# Patient Record
Sex: Male | Born: 1975
Health system: Southern US, Community
[De-identification: ages and names within clinical notes are randomized; demographics above are authoritative.]

## PROBLEM LIST (undated history)

## (undated) DIAGNOSIS — E785 Hyperlipidemia, unspecified: Secondary | ICD-10-CM

## (undated) DIAGNOSIS — J45909 Unspecified asthma, uncomplicated: Secondary | ICD-10-CM

## (undated) HISTORY — PX: OTHER SURGICAL HISTORY: SHX169

## (undated) HISTORY — DX: Hyperlipidemia, unspecified: E78.5

---

## 1998-05-21 ENCOUNTER — Inpatient Hospital Stay (HOSPITAL_COMMUNITY): Admission: EM | Admit: 1998-05-21 | Discharge: 1998-05-21 | Payer: Self-pay | Admitting: Emergency Medicine

## 1998-05-21 ENCOUNTER — Encounter: Payer: Self-pay | Admitting: *Deleted

## 2000-03-22 ENCOUNTER — Encounter: Payer: Self-pay | Admitting: Emergency Medicine

## 2000-03-22 ENCOUNTER — Emergency Department (HOSPITAL_COMMUNITY): Admission: EM | Admit: 2000-03-22 | Discharge: 2000-03-22 | Payer: Self-pay | Admitting: Emergency Medicine

## 2000-05-18 ENCOUNTER — Ambulatory Visit (HOSPITAL_COMMUNITY): Admission: RE | Admit: 2000-05-18 | Discharge: 2000-05-18 | Payer: Self-pay | Admitting: Internal Medicine

## 2004-10-13 ENCOUNTER — Emergency Department (HOSPITAL_COMMUNITY): Admission: EM | Admit: 2004-10-13 | Discharge: 2004-10-13 | Payer: Self-pay | Admitting: Emergency Medicine

## 2006-02-14 ENCOUNTER — Encounter: Admission: RE | Admit: 2006-02-14 | Discharge: 2006-02-14 | Payer: Self-pay | Admitting: Internal Medicine

## 2007-11-01 ENCOUNTER — Encounter: Admission: RE | Admit: 2007-11-01 | Discharge: 2007-11-01 | Payer: Self-pay | Admitting: Internal Medicine

## 2014-07-23 ENCOUNTER — Other Ambulatory Visit: Payer: Self-pay | Admitting: Internal Medicine

## 2014-07-23 ENCOUNTER — Ambulatory Visit
Admission: RE | Admit: 2014-07-23 | Discharge: 2014-07-23 | Disposition: A | Discharge status: Home / Self Care | Payer: 59 | Source: Ambulatory Visit | Attending: Internal Medicine | Admitting: Internal Medicine

## 2014-07-23 DIAGNOSIS — K5900 Constipation, unspecified: Secondary | ICD-10-CM

## 2014-08-01 ENCOUNTER — Other Ambulatory Visit: Payer: Self-pay | Admitting: Internal Medicine

## 2014-08-01 DIAGNOSIS — R7989 Other specified abnormal findings of blood chemistry: Secondary | ICD-10-CM

## 2014-08-01 DIAGNOSIS — N289 Disorder of kidney and ureter, unspecified: Secondary | ICD-10-CM

## 2014-08-05 ENCOUNTER — Ambulatory Visit
Admission: RE | Admit: 2014-08-05 | Discharge: 2014-08-05 | Disposition: A | Discharge status: Home / Self Care | Payer: 59 | Source: Ambulatory Visit | Attending: Internal Medicine | Admitting: Internal Medicine

## 2014-08-05 DIAGNOSIS — N289 Disorder of kidney and ureter, unspecified: Secondary | ICD-10-CM

## 2014-08-05 DIAGNOSIS — R7989 Other specified abnormal findings of blood chemistry: Secondary | ICD-10-CM

## 2016-02-05 DIAGNOSIS — J069 Acute upper respiratory infection, unspecified: Secondary | ICD-10-CM | POA: Diagnosis not present

## 2017-02-17 DIAGNOSIS — R202 Paresthesia of skin: Secondary | ICD-10-CM | POA: Diagnosis not present

## 2017-04-07 DIAGNOSIS — K219 Gastro-esophageal reflux disease without esophagitis: Secondary | ICD-10-CM | POA: Diagnosis not present

## 2017-04-07 DIAGNOSIS — R072 Precordial pain: Secondary | ICD-10-CM | POA: Diagnosis not present

## 2017-04-07 DIAGNOSIS — E785 Hyperlipidemia, unspecified: Secondary | ICD-10-CM | POA: Diagnosis not present

## 2017-04-10 ENCOUNTER — Encounter: Payer: Self-pay | Admitting: Cardiovascular Disease

## 2017-04-14 ENCOUNTER — Encounter: Payer: Self-pay | Admitting: Cardiovascular Disease

## 2017-04-14 ENCOUNTER — Telehealth (HOSPITAL_COMMUNITY): Payer: Self-pay

## 2017-04-14 ENCOUNTER — Ambulatory Visit: Payer: 59 | Admitting: Cardiovascular Disease

## 2017-04-14 VITALS — BP 140/88 | HR 67 | Ht 69.0 in | Wt 186.0 lb

## 2017-04-14 DIAGNOSIS — E781 Pure hyperglyceridemia: Secondary | ICD-10-CM

## 2017-04-14 DIAGNOSIS — R0789 Other chest pain: Secondary | ICD-10-CM | POA: Diagnosis not present

## 2017-04-14 NOTE — Patient Instructions (Signed)
Medication Instructions:  Your physician recommends that you continue on your current medications as directed. Please refer to the Current Medication list given to you today.  Testing/Procedures: Your physician has requested that you have an exercise tolerance test. For further information please visit [email or web address] Please also follow instruction sheet, as given.  Your physician has requested that you have an echocardiogram. Echocardiography is a painless test that uses sound waves to create images of your heart. It provides your doctor with information about the size and shape of your heart and how well your heart's chambers and valves are working. This procedure takes approximately one hour. There are no restrictions for this procedure.  [facility]:  [facility]  Follow-Up: As needed unless testing abnormal  Any Other Special Instructions Will Be Listed Below (If Applicable).     If you need a refill on your cardiac medications before your next appointment, please call your pharmacy.

## 2017-04-14 NOTE — Progress Notes (Signed)
Cardiology Office Note    Date:  04/15/2017   ID:  Brandon Lawson, DOB 03/07/1975, MRN [id number]  PCP:  Georgann Housekeeper, MD  Cardiologist:  Nicki Guadalajara, MD   New cardiology consultation referred through the courtesy of Dr. Eula Listen  History of Present Illness:  Brandon Lawson is a 42 y.o. male African-American male who isreferred Dr. Eula Listen for evaluation of recent chest pain and possible stress testing.  [name] denies any cardiac history.  He is active and exercises regularly.  He often runs on a treadmill for at least 3 miles at a 19-21-minute pace.  Recently, he was performing exercises which burbees, push-ups, and jumps.  On that particular day, he most likely did a total of 150 total push-ups.  These are being done in increments of 10-20 at a time at a fast space.  During the latter portion he did start noticing some tenderness in the left lateral chest wall towards his shoulder.  He specifically denies any exertional precipitation of chest pain with runniing.  He was evaluated by Dr. Eula Listen.  Troponin was negative.  There were no acute ECG changes.  Lipid studies were notable for total cholesterol of 199, LDL cholesterol 123, non-HDL cholesterol 138, HDL 61 and triglycerides 78.  He had normal renal function.  He is now referred for cardiology evaluation.    Past Medical History:  Diagnosis Date  . Hyperlipidemia     Current Medications: Outpatient Medications Prior to Visit  Medication Sig Dispense Refill  . Omega-3 1000 MG CAPS Take by mouth.     No facility-administered medications prior to visit.      Allergies:   Patient has no allergy information on record.   Social History   Socioeconomic History  . Marital status: Married    Spouse name: None  . Number of children: 3  . Years of education: None  . Highest education level: Bachelor's degree (e.g., BA, AB, BS)  Social Needs  . Financial resource strain: None  . Food insecurity - worry: None  . Food  insecurity - inability: None  . Transportation needs - medical: None  . Transportation needs - non-medical: None  Occupational History  . 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PT    Employer: [employer]  Tobacco Use  . Smoking status: Former Games developer  . Smokeless tobacco: Current User    Types: Chew  . Tobacco comment: every now and then   Substance and Sexual Activity  . Alcohol use: Yes    Alcohol/week: 4.2 oz    Types: 7 Glasses of wine per week    Comment: Socially   . Drug use: No  . Sexual activity: None  Other Topics Concern  . None  Social History Narrative  . None    Social history is notable in that he graduated from high school.  He played baseball for [employer] as a center [name].  He does not smoke cigarettes, but he chews tobacco.  He drinks occasional wine.  He runs and lifts at least 5 days/week.  Family History:  The patient's family history includes Coronary artery disease in his father; Heart attack in his father and maternal grandmother; Heart disease in his father; Hyperlipidemia in his mother; Hypertension in his mother.  He is just reestablishing a relationship with his father.  His father had open heart surgery.  ROS General: Negative; No fevers, chills, or night sweats;  HEENT: Negative; No changes in vision or hearing, sinus congestion, difficulty swallowing Pulmonary: Negative;  No cough, wheezing, shortness of breath, hemoptysis Cardiovascular:  See HPI GI: Negative; No nausea, vomiting, diarrhea, or abdominal pain GU: Negative; No dysuria, hematuria, or difficulty voiding Musculoskeletal: Negative; no myalgias, joint pain, or weakness Hematologic/Oncology: Negative; no easy bruising, bleeding Endocrine: Negative; no heat/cold intolerance; no diabetes Neuro: Negative; no changes in balance, headaches Skin: Negative; No rashes or skin lesions Psychiatric: Negative; No behavioral problems, depression Sleep: Negative; No snoring, daytime sleepiness,  hypersomnolence, bruxism, restless legs, hypnogognic hallucinations, no cataplexy Other comprehensive 14 point system review is negative.   PHYSICAL EXAM:   VS:  BP 140/88   Pulse 67   Ht 5\' 9"  (1.753 m)   Wt 186 lb (84.4 kg)   BMI 27.47 kg/m     Repeat blood pressure by me was 124/84 supine and 126/84 standing  Wt Readings from Last 3 Encounters:  04/14/17 186 lb (84.4 kg)    General: Alert, oriented, no distress.  Skin: normal turgor, no rashes, warm and dry HEENT: Normocephalic, atraumatic. Pupils equal round and reactive to light; sclera anicteric; extraocular muscles intact; Fundi normal Nose without nasal septal hypertrophy Mouth/Parynx benign; Mallinpatti scale 2/3 Neck: No JVD, no carotid bruits; normal carotid upstroke Lungs: clear to ausculatation and percussion; no wheezing or rales Chest wall: without tenderness to palpitation Heart: PMI not displaced, RRR, s1 s2 normal, 1/6 systolic murmur, no diastolic murmur, no rubs, gallops, thrills, or heaves Abdomen: soft, nontender; no hepatosplenomehaly, BS+; abdominal aorta nontender and not dilated by palpation. Back: no CVA tenderness Pulses 2+ Musculoskeletal: full range of motion, normal strength, no joint deformities Extremities: no clubbing cyanosis or edema, Homan's sign negative  Neurologic: grossly nonfocal; Cranial nerves grossly wnl Psychologic: Normal mood and affect   Studies/Labs Reviewed:   EKG:  EKG is ordered today.  ECG (independently read by me): Sinus rhythm at 67 bpm.  No ectopy; normal intervals  I personally reviewed the ECG from [facility] dated April 07, 2017 which showed sinus rhythm at 78 bpm.  No ST segment changes.  Recent Labs: No flowsheet data found.   No flowsheet data found.  No flowsheet data found. No results found for: MCV No results found for: TSH No results found for: HGBA1C   BNP No results found for: BNP  ProBNP No results found for: PROBNP   Lipid Panel  No results  found for: CHOL, TRIG, HDL, CHOLHDL, VLDL, LDLCALC, LDLDIRECT   RADIOLOGY: No results found.   Additional studies/ records that were reviewed today include:  Records from [place] were reviewed.    ASSESSMENT:    1. Atypical chest pain   2. Pure hyperglyceridemia      PLAN:  Mr. Kaisyn Millea is a 42 year old active gentleman who exercises regularly.  He recently developed left-sided chest discomfort which seem to be in the region of the pectoral tendon which most likely was mild tendon inflammation resulting from his physical workout with numerous fast push-ups and Burpee's.  His chest pain did not occur with activity and developed the discofort  after working out.  I suspect this was chest wall pain rather than ischemic chest pain from underlying coronary artery disease.  However, there is nicotine use with chewing tobacco we discussed the importance of complete discontinuance.  I also spent considerable time with him reviewing data regarding subclinical atherosclerosis.  His LDL level is 123 and at a level where subclinical plaque can develop.  He has been hesitant to exercise until his cardiac evaluation.  I scheduled him for routine treadmill  test as an initial screen of his chest pain.  This will hopefully reassurance of the results.  I will also schedule him for echo Doppler study to make certain there is no structural heart disease or abnormal hypertrophy.  We discussed potential initiation of lipid-lowering therapy if he cannot achieve an LDL less than 100 and discussed with him data regarding plaque regression at levels typically seen in the 60s or below.  I have recommended repeat laboratory in approximately 3 months.  If his studies are normal, I will notify him of the results and be available on an as-needed basis.   Medication Adjustments/Labs and Tests Ordered: Current medicines are reviewed at length with the patient today.  Concerns regarding medicines are outlined above.   Medication changes, Labs and Tests ordered today are listed in the Patient Instructions below. Patient Instructions  Medication Instructions:  Your physician recommends that you continue on your current medications as directed. Please refer to the Current Medication list given to you today.  Testing/Procedures: Your physician has requested that you have an exercise tolerance test. For further information please visit [email or web address]. Please also follow instruction sheet, as given.  Your physician has requested that you have an echocardiogram. Echocardiography is a painless test that uses sound waves to create images of your heart. It provides your doctor with information about the size and shape of your heart and how well your heart's chambers and valves are working. This procedure takes approximately one hour. There are no restrictions for this procedure.  [facility]  [facility]  Follow-Up: As needed unless testing abnormal  Any Other Special Instructions Will Be Listed Below (If Applicable).     If you need a refill on your cardiac medications before your next appointment, please call your pharmacy.      Signed, Nicki Guadalajarahomas Kelly, MD  04/15/2017 10:59 AM    [facility] [address], [address], [place], [place]  [id number] Phone: [phone]

## 2017-04-14 NOTE — Telephone Encounter (Signed)
Encounter complete. 

## 2017-04-15 ENCOUNTER — Encounter: Payer: Self-pay | Admitting: Cardiovascular Disease

## 2017-04-18 ENCOUNTER — Ambulatory Visit (HOSPITAL_COMMUNITY)
Admission: RE | Admit: 2017-04-18 | Discharge: 2017-04-18 | Disposition: A | Discharge status: Home / Self Care | Payer: 59 | Source: Ambulatory Visit | Attending: Cardiovascular Disease | Admitting: Cardiovascular Disease

## 2017-04-18 DIAGNOSIS — R0789 Other chest pain: Secondary | ICD-10-CM | POA: Insufficient documentation

## 2017-04-18 LAB — EXERCISE TOLERANCE TEST
CHL RATE OF PERCEIVED EXERTION: 18
CSEPED: 15 min
CSEPEDS: 0 s
CSEPEW: 17.2 METS
CSEPPHR: 179 {beats}/min
MPHR: 178 {beats}/min
Percent HR: 100 %
Rest HR: 90 {beats}/min

## 2017-04-20 ENCOUNTER — Ambulatory Visit (HOSPITAL_COMMUNITY): Payer: 59 | Attending: Cardiology

## 2017-04-20 ENCOUNTER — Other Ambulatory Visit: Payer: Self-pay

## 2017-04-20 DIAGNOSIS — R0789 Other chest pain: Secondary | ICD-10-CM | POA: Insufficient documentation

## 2017-04-20 DIAGNOSIS — E785 Hyperlipidemia, unspecified: Secondary | ICD-10-CM | POA: Diagnosis not present

## 2017-04-20 DIAGNOSIS — I77810 Thoracic aortic ectasia: Secondary | ICD-10-CM | POA: Insufficient documentation

## 2017-04-20 DIAGNOSIS — R079 Chest pain, unspecified: Secondary | ICD-10-CM | POA: Diagnosis present

## 2017-04-28 ENCOUNTER — Encounter: Payer: Self-pay | Admitting: *Deleted

## 2018-03-07 DIAGNOSIS — Z0000 Encounter for general adult medical examination without abnormal findings: Secondary | ICD-10-CM | POA: Diagnosis not present

## 2018-03-07 DIAGNOSIS — E785 Hyperlipidemia, unspecified: Secondary | ICD-10-CM | POA: Diagnosis not present

## 2018-12-11 ENCOUNTER — Other Ambulatory Visit: Payer: Self-pay

## 2018-12-11 DIAGNOSIS — Z20822 Contact with and (suspected) exposure to covid-19: Secondary | ICD-10-CM

## 2018-12-13 LAB — NOVEL CORONAVIRUS, NAA: SARS-CoV-2, NAA: NOT DETECTED

## 2018-12-14 ENCOUNTER — Emergency Department (HOSPITAL_COMMUNITY)
Admission: EM | Admit: 2018-12-14 | Discharge: 2018-12-14 | Disposition: A | Discharge status: Home / Self Care | Payer: 59 | Attending: Emergency Medicine | Admitting: Emergency Medicine

## 2018-12-14 ENCOUNTER — Emergency Department (HOSPITAL_COMMUNITY): Payer: 59

## 2018-12-14 ENCOUNTER — Other Ambulatory Visit: Payer: Self-pay

## 2018-12-14 ENCOUNTER — Encounter (HOSPITAL_COMMUNITY): Payer: Self-pay | Admitting: Emergency Medicine

## 2018-12-14 DIAGNOSIS — R5383 Other fatigue: Secondary | ICD-10-CM | POA: Diagnosis not present

## 2018-12-14 DIAGNOSIS — R0981 Nasal congestion: Secondary | ICD-10-CM | POA: Diagnosis not present

## 2018-12-14 DIAGNOSIS — F17220 Nicotine dependence, chewing tobacco, uncomplicated: Secondary | ICD-10-CM | POA: Diagnosis not present

## 2018-12-14 DIAGNOSIS — Z79899 Other long term (current) drug therapy: Secondary | ICD-10-CM | POA: Diagnosis not present

## 2018-12-14 DIAGNOSIS — J45909 Unspecified asthma, uncomplicated: Secondary | ICD-10-CM | POA: Insufficient documentation

## 2018-12-14 DIAGNOSIS — R0789 Other chest pain: Secondary | ICD-10-CM

## 2018-12-14 HISTORY — DX: Unspecified asthma, uncomplicated: J45.909

## 2018-12-14 LAB — BASIC METABOLIC PANEL
Anion gap: 8 (ref 5–15)
BUN: 11 mg/dL (ref 6–20)
CO2: 28 mmol/L (ref 22–32)
Calcium: 9.5 mg/dL (ref 8.9–10.3)
Chloride: 102 mmol/L (ref 98–111)
Creatinine, Ser: 1.19 mg/dL (ref 0.61–1.24)
GFR calc Af Amer: >60 mL/min (ref >60)
GFR calc non Af Amer: >60 mL/min (ref >60)
Glucose, Bld: 93 mg/dL (ref 70–99)
Potassium: 4.3 mmol/L (ref 3.5–5.1)
Sodium: 138 mmol/L (ref 135–145)

## 2018-12-14 LAB — TROPONIN I (HIGH SENSITIVITY)
Troponin I (High Sensitivity): <2 ng/L (ref <18)
Troponin I (High Sensitivity): <2 ng/L (ref <18)

## 2018-12-14 LAB — CBC
HCT: 48.3 % (ref 39.0–52.0)
Hemoglobin: 16.2 g/dL (ref 13.0–17.0)
MCH: 30.9 pg (ref 26.0–34.0)
MCHC: 33.5 g/dL (ref 30.0–36.0)
MCV: 92.2 fL (ref 80.0–100.0)
Platelets: 246 10*3/uL (ref 150–400)
RBC: 5.24 MIL/uL (ref 4.22–5.81)
RDW: 11.7 % (ref 11.5–15.5)
WBC: 4.8 10*3/uL (ref 4.0–10.5)
nRBC: 0 % (ref 0.0–0.2)

## 2018-12-14 MED ORDER — CETIRIZINE-PSEUDOEPHEDRINE ER 5-120 MG PO TB12: 1.0000 | ORAL_TABLET | Freq: Every day | ORAL | 0 refills | Status: DC

## 2018-12-14 MED ORDER — FLUTICASONE PROPIONATE 50 MCG/ACT NA SUSP: 2.0000 | Freq: Every day | NASAL | 1 refills | Status: DC

## 2018-12-14 MED ORDER — DEXAMETHASONE SODIUM PHOSPHATE 4 MG/ML IJ SOLN
4.0000 mg | Freq: Once | INTRAMUSCULAR | Status: AC
  Administered 2018-12-14: 20:00:00 4 mg via INTRAVENOUS
  Filled 2018-12-14: qty 1

## 2018-12-14 NOTE — ED Notes (Signed)
Pt provided labeled urine specimen cup and culture. [employer]

## 2018-12-14 NOTE — Discharge Instructions (Addendum)
Please return if you have any new or concerning symptoms including chest pain, difficulty breathing, or any new or concerning symptoms.  Use Sudafed as needed for congestion.  This will increase her blood pressure her you do not have elevated blood pressure in ED today with concern for this being a problem.

## 2018-12-14 NOTE — ED Triage Notes (Addendum)
Per pt, states he has seasonal allergies-has been having some chest tightness for about a week-states he usually runs 3 miles every other day-only able to walk about a mile-states chest tightness only with exertion-called PCP and was given an inhaler-states minimal improvement with inhaler

## 2018-12-14 NOTE — ED Provider Notes (Addendum)
[facility]-EMERGENCY DEPT Provider Note   CSN: [id number] Arrival date & time: 12/14/18  1341     History   Chief Complaint Chief Complaint  Patient presents with  . Chest Pain    HPI Brandon Lawson is a 43 y.o. male seasonal allergies and asthma, HLD  HPI: A 43 year old patient with a history of hypercholesterolemia presents for evaluation of chest pain. Initial onset of pain was more than 6 hours ago. The patient's chest pain is described as heaviness/pressure/tightness and is worse with exertion. The patient's chest pain is not middle- or left-sided, is not well-localized, is not sharp and does not radiate to the arms/jaw/neck. The patient does not complain of nausea and denies diaphoresis. The patient has no history of stroke, has no history of peripheral artery disease, has not smoked in the past 90 days, denies any history of treated diabetes, has no relevant family history of coronary artery disease (first degree relative at less than age 44), is not hypertensive and does not have an elevated BMI (>=30).   HPI  Patient states he has had chest tightness for 1 week associated symptoms include sinus congestion, fatigue.  Patient states he has these symptoms during the fall every year typically uses an albuterol inhaler prescribed by his PCP to alleviate symptoms during his time however he states symptoms only been moderately improved with the albuterol inhaler.  States he has used Alka-Seltzer as well without relief.  Has tried no other medications.  Patient states he ran 3 miles every day and over the past week he has felt less energetic and was able to run 1 mile a day and has not felt like running last couple days.  Patient denies any chest pain, arm pain, jaw pain.   Patient was tested within the week for Covid test negative.  Denies any known exposure to Covid positive people.  Denies any history of clot, recent travel, surgery, trauma, not on hormones, no  hemoptysis or cough, no pleuritic chest pain.  Past Medical History:  Diagnosis Date  . Asthma   . Hyperlipidemia     There are no active problems to display for this patient.   History reviewed. No pertinent surgical history.      Home Medications    Prior to Admission medications   Medication Sig Start Date End Date Taking? Authorizing Provider  Multiple Vitamin (MULTIVITAMIN WITH MINERALS) TABS tablet Take 1 tablet by mouth daily.   Yes [provider]  naphazoline-glycerin (CLEAR EYES REDNESS) 0.012-0.2 % SOLN Place 1-2 drops into both eyes 4 (four) times daily as needed for eye irritation.   Yes [provider]  Omega-3 1000 MG CAPS Take 2 capsules by mouth daily.    Yes [provider]  VENTOLIN HFA 108 (90 Base) MCG/ACT inhaler Inhale 1-2 puffs into the lungs every 4 (four) hours as needed for wheezing or shortness of breath. 12/11/18  Yes [provider]  cetirizine-pseudoephedrine (ZYRTEC-D) 5-120 MG tablet Take 1 tablet by mouth daily. 12/14/18   Joclyn Alsobrook, Rodrigo RanWylder S, PA  fluticasone (FLONASE) 50 MCG/ACT nasal spray Place 2 sprays into both nostrils daily. 12/14/18   Gailen ShelterFondaw, Jejuan Scala S, PA    Family History Family History  Problem Relation Age of Onset  . Hypertension Mother   . Hyperlipidemia Mother   . Heart attack Father   . Heart disease Father   . Coronary artery disease Father   . Heart attack Maternal Grandmother     Social History  Social History   Tobacco Use  . Smoking status: Former Research scientist (life sciences)  . Smokeless tobacco: Current User    Types: Chew  . Tobacco comment: every now and then   Substance Use Topics  . Alcohol use: Yes    Alcohol/week: 7.0 standard drinks    Types: 7 Glasses of wine per week    Comment: Socially   . Drug use: No     Allergies   Patient has no known allergies.   Review of Systems Review of Systems  Constitutional: Positive for fatigue. Negative for chills and fever.  HENT: Positive for  congestion.   Respiratory: Positive for chest tightness and shortness of breath. Negative for cough and wheezing.   Cardiovascular: Negative for chest pain and palpitations.  All other systems reviewed and are negative.    Physical Exam Updated Vital Signs BP 134/88   Pulse (!) 57   Temp 98.1 F (36.7 C) (Oral)   Resp 15   SpO2 100%   Physical Exam Vitals signs and nursing note reviewed.  Constitutional:      General: He is not in acute distress.    Comments: Is well-appearing sitting for bed pleasant healthy male no acute distress breathing well on room air oxygenating at 100%.  Speaking full sentences able to follow commands and answer questions appropriately.  HENT:     Head: Normocephalic and atraumatic.     Nose: Nose normal.  Eyes:     General: No scleral icterus. Neck:     Musculoskeletal: Normal range of motion.  Cardiovascular:     Rate and Rhythm: Normal rate and regular rhythm.     Pulses: Normal pulses.     Heart sounds: Normal heart sounds.  Pulmonary:     Effort: Pulmonary effort is normal. No respiratory distress.     Breath sounds: Normal breath sounds. No wheezing.  Abdominal:     Palpations: Abdomen is soft.     Tenderness: There is no abdominal tenderness.  Musculoskeletal:     Right lower leg: No edema.     Left lower leg: No edema.  Skin:    General: Skin is warm and dry.     Capillary Refill: Capillary refill takes less than 2 seconds.  Neurological:     Mental Status: He is alert. Mental status is at baseline.  Psychiatric:        Mood and Affect: Mood normal.        Behavior: Behavior normal.      ED Treatments / Results  Labs (all labs ordered are listed, but only abnormal results are displayed) Labs Reviewed  BASIC METABOLIC PANEL  CBC  TROPONIN I (HIGH SENSITIVITY)  TROPONIN I (HIGH SENSITIVITY)    EKG EKG Interpretation  Date/Time:  Friday December 14 2018 13:53:32 EST Ventricular Rate:  68 PR Interval:    QRS Duration:  83 QT Interval:  376 QTC Calculation: 400 R Axis:   69 Text Interpretation: Sinus rhythm No significant change was found Confirmed by Gerlene Fee [phone]) on 12/14/2018 5:29:47 PM   Radiology Dg Chest 2 View  Result Date: 12/14/2018 CLINICAL DATA:  Chest pain, chest tightness EXAM: CHEST - 2 VIEW COMPARISON:  08/28/2012 FINDINGS: The heart size and mediastinal contours are within normal limits. Both lungs are clear. The visualized skeletal structures are unremarkable. IMPRESSION: No acute abnormality of the lungs. Electronically Signed   By: Eddie Candle M.D.   On: 12/14/2018 14:33    Procedures Procedures (including critical care time)  Medications Ordered in ED Medications - No data to display   Initial Impression / Assessment and Plan / ED Course  I have reviewed the triage vital signs and the nursing notes.  Pertinent labs & imaging results that were available during my care of the patient were reviewed by me and considered in my medical decision making (see chart for details).     HEAR Score: 2  Patient with history of asthma that he states occurs after rounds time.  Patient states that his frequent rounds he has been feeling some chest tightness and shortness of breath.  Denies any chest pain, denies any recent viral illness.  Doubt pericarditis as there is no friction rub and EKG is within normal limits.  Troponin x2 drawn at triage were normal.  Doubt pneumonia as patient is afebrile with normal lung sounds and no infiltrate on x-ray.  Patient has reassuring benign blood work with no white count no electrolyte abnormalities.  Initial troponin less than 2.  likely symptoms are due to seasonal allergies/asthma.  Prescribed fluticasone and Zyrtec for allergy symptoms and recommended continue use of albuterol inhaler as needed.  Given strict return precautions.  Heart score is 2.  Will discharge patient home with follow-up instructions to see PCP.  Patient vitals within  normal limits other than mild bradycardia which is likely due to patient's physical fit and healthy lifestyle.   Final Clinical Impressions(s) / ED Diagnoses   Final diagnoses:  Chest tightness  Sinus congestion    ED Discharge Orders         Ordered    cetirizine-pseudoephedrine (ZYRTEC-D) 5-120 MG tablet  Daily     12/14/18 1830    fluticasone (FLONASE) 50 MCG/ACT nasal spray  Daily     12/14/18 1830           Gailen Shelter, [place] 12/14/18 1942    Sabas Sous, MD 12/15/18 0044    Gailen Shelter, PA 12/15/18 3818    Sabas Sous, MD 12/16/18 1505

## 2019-05-01 ENCOUNTER — Ambulatory Visit
Admission: RE | Admit: 2019-05-01 | Discharge: 2019-05-01 | Disposition: A | Discharge status: Home / Self Care | Payer: 59 | Source: Ambulatory Visit | Attending: Internal Medicine | Admitting: Internal Medicine

## 2019-05-01 ENCOUNTER — Other Ambulatory Visit: Payer: Self-pay | Admitting: Internal Medicine

## 2019-05-01 DIAGNOSIS — M549 Dorsalgia, unspecified: Secondary | ICD-10-CM

## 2019-12-19 ENCOUNTER — Ambulatory Visit: Payer: 59 | Attending: Internal Medicine

## 2019-12-19 DIAGNOSIS — Z23 Encounter for immunization: Secondary | ICD-10-CM

## 2019-12-19 NOTE — Progress Notes (Signed)
   Covid-19 Vaccination Clinic  Name:  Brandon Lawson    MRN: [id number] DOB: [birth year]  12/19/2019  Mr. Brandon Lawson was observed post Covid-19 immunization for 15 minutes without incident. He was provided with Vaccine Information Sheet and instruction to access the V-Safe system.   Mr. Brandon Lawson was instructed to call 911 with any severe reactions post vaccine: Marland Kitchen Difficulty breathing  . Swelling of face and throat  . A fast heartbeat  . A bad rash all over body  . Dizziness and weakness   Immunizations Administered    Name Date Dose VIS Date Route   Moderna COVID-19 Vaccine 12/19/2019  4:10 AM 0.5 mL 11/20/2019 Intramuscular   Manufacturer: Moderna   Lot: [id number]   NDC: 49675-916-38

## 2021-01-31 HISTORY — PX: COLONOSCOPY: SHX174

## 2021-10-02 IMAGING — CR DG THORACIC SPINE 3V
3 series · 3 of 3 positions shown · non-contrast
Comparison: None.

CLINICAL DATA: Back pain.

EXAM:
THORACIC SPINE - 3 VIEWS

[t t-spine a.p.]
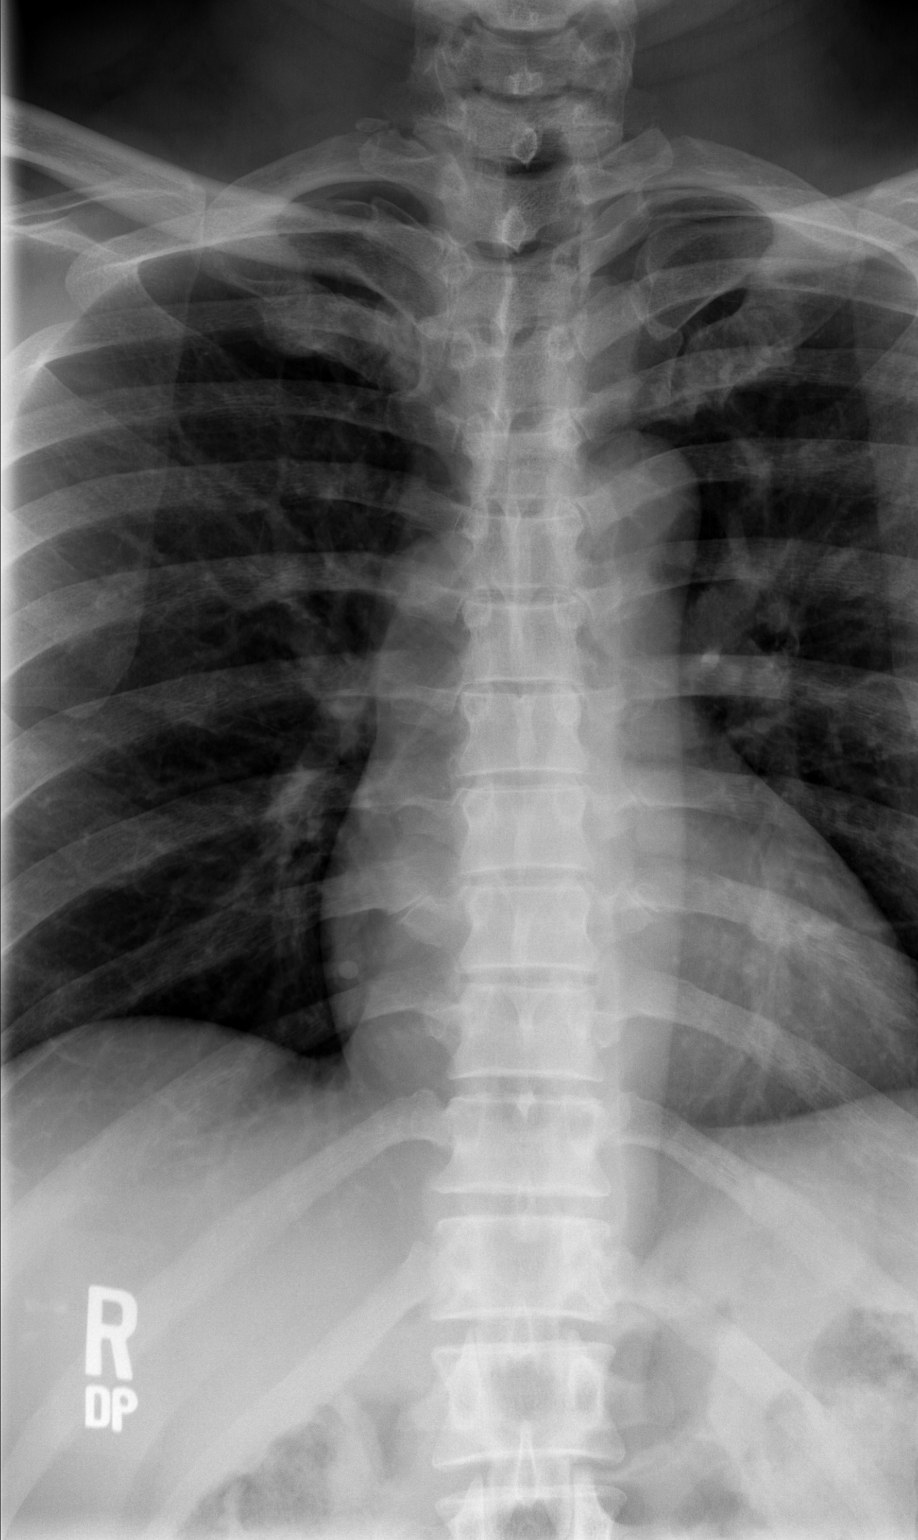

[t t-spine lat *]
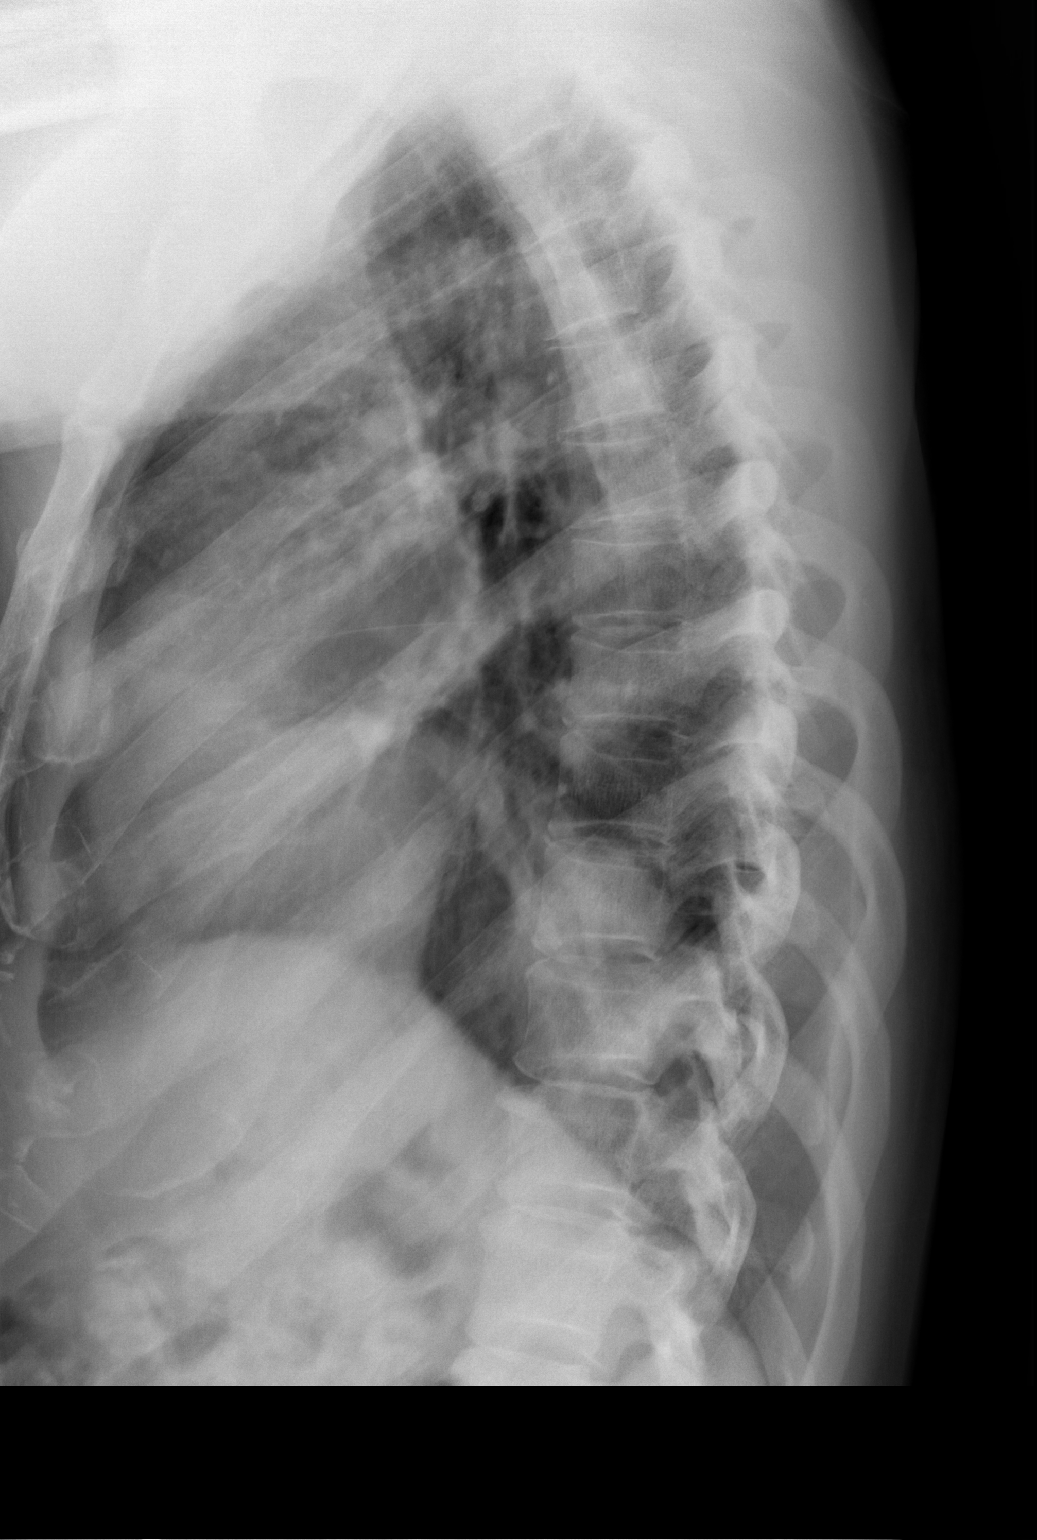

[t swimmers]
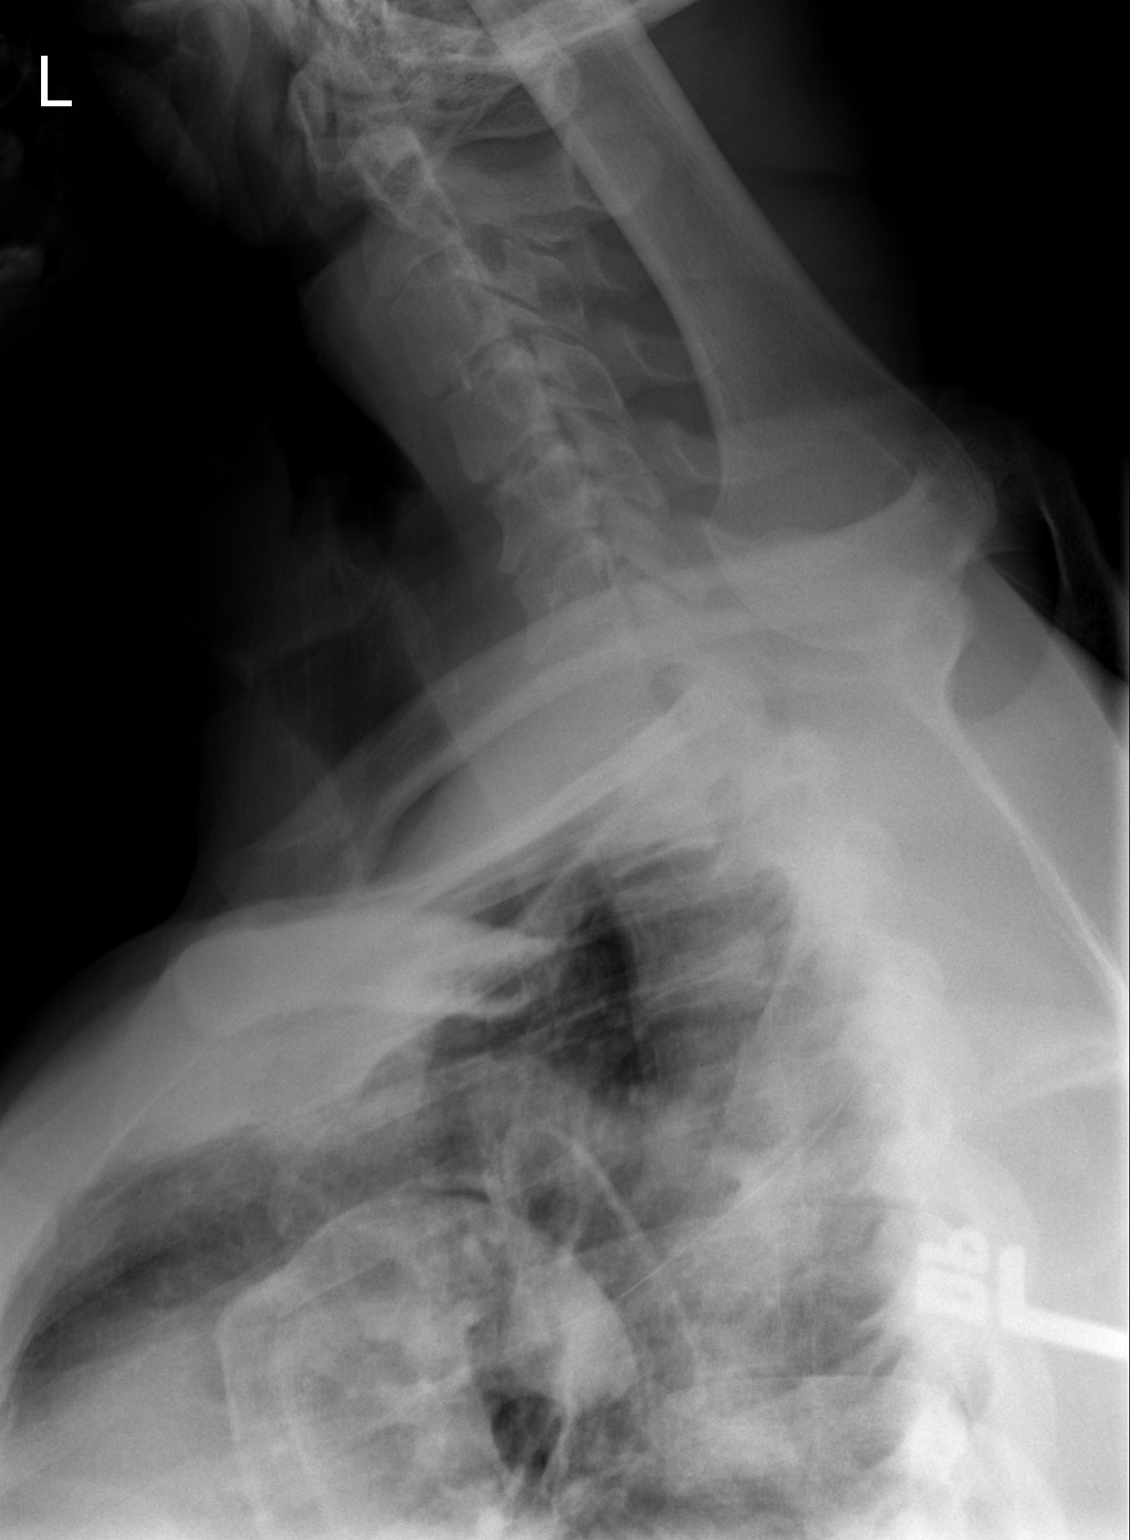

[3 of 3 positions shown; findings below may reference images not displayed]

FINDINGS: There is no evidence of thoracic spine fracture. Alignment is
normal. No other significant bone abnormalities are identified.
IMPRESSION: Negative.

## 2022-01-27 ENCOUNTER — Emergency Department (HOSPITAL_BASED_OUTPATIENT_CLINIC_OR_DEPARTMENT_OTHER)
Admission: EM | Admit: 2022-01-27 | Discharge: 2022-01-27 | Disposition: A | Discharge status: Home / Self Care | Payer: No Typology Code available for payment source | Attending: Emergency Medicine | Admitting: Emergency Medicine

## 2022-01-27 ENCOUNTER — Other Ambulatory Visit: Payer: Self-pay

## 2022-01-27 ENCOUNTER — Emergency Department (HOSPITAL_BASED_OUTPATIENT_CLINIC_OR_DEPARTMENT_OTHER): Payer: 59

## 2022-01-27 DIAGNOSIS — X501XXA Overexertion from prolonged static or awkward postures, initial encounter: Secondary | ICD-10-CM | POA: Insufficient documentation

## 2022-01-27 DIAGNOSIS — S299XXA Unspecified injury of thorax, initial encounter: Secondary | ICD-10-CM | POA: Diagnosis present

## 2022-01-27 DIAGNOSIS — S29011A Strain of muscle and tendon of front wall of thorax, initial encounter: Secondary | ICD-10-CM | POA: Diagnosis not present

## 2022-01-27 DIAGNOSIS — J45909 Unspecified asthma, uncomplicated: Secondary | ICD-10-CM | POA: Insufficient documentation

## 2022-01-27 MED ORDER — NAPROXEN 500 MG PO TABS: 500.0000 mg | ORAL_TABLET | Freq: Two times a day (BID) | ORAL | 0 refills | Status: AC

## 2022-01-27 MED ORDER — IBUPROFEN 800 MG PO TABS
800.0000 mg | ORAL_TABLET | Freq: Once | ORAL | Status: AC
  Administered 2022-01-27: 800 mg via ORAL
  Filled 2022-01-27: qty 1

## 2022-01-27 NOTE — ED Provider Notes (Signed)
[facility] EMERGENCY DEPT Provider Note   CSN: [id number] Arrival date & time: 01/27/22  1619     History  Chief Complaint  Patient presents with   Pectoral Pain     Brandon Lawson is a 46 y.o. male with a past medical history significant for hyperlipidemia and asthma who presents to the ED due to right pectoral muscle pain.  Patient states he was bench pressing when he felt a pop around his right pectoral muscle.  Patient admits to pain with overhead movement.  Admits some intermittent numbness/tingling of right upper extremity.  Denies weakness.  Patient states pain radiates down her right arm. No shortness of breath. Pain worse with movement of right arm.   History obtained from patient and past medical records. No interpreter used during encounter.       Home Medications Prior to Admission medications   Medication Sig Start Date End Date Taking? Authorizing Provider  naproxen (NAPROSYN) 500 MG tablet Take 1 tablet (500 mg total) by mouth 2 (two) times daily. 01/27/22  Yes Oralee Rapaport C, PA-C  cetirizine-pseudoephedrine (ZYRTEC-D) 5-120 MG tablet Take 1 tablet by mouth daily. 12/14/18   Fondaw, Rodrigo Ran, PA  fluticasone (FLONASE) 50 MCG/ACT nasal spray Place 2 sprays into both nostrils daily. 12/14/18   Gailen Shelter, PA  Multiple Vitamin (MULTIVITAMIN WITH MINERALS) TABS tablet Take 1 tablet by mouth daily.    [provider]  naphazoline-glycerin (CLEAR EYES REDNESS) 0.012-0.2 % SOLN Place 1-2 drops into both eyes 4 (four) times daily as needed for eye irritation.    [provider]  Omega-3 1000 MG CAPS Take 2 capsules by mouth daily.     [provider]  VENTOLIN HFA 108 (90 Base) MCG/ACT inhaler Inhale 1-2 puffs into the lungs every 4 (four) hours as needed for wheezing or shortness of breath. 12/11/18   [provider]      Allergies    Patient has no known allergies.    Review of Systems   Review of Systems   Constitutional:  Negative for chills and fever.  Respiratory:  Negative for shortness of breath.   Cardiovascular:  Positive for chest pain (right sided pectoral muscle pain).  Musculoskeletal:  Positive for myalgias.    Physical Exam Updated Vital Signs BP (!) 148/99 (BP Location: Left Arm)   Pulse 79   Temp 98.6 F (37 C) (Oral)   Resp 18   Ht 5\' 9"  (1.753 m)   Wt 86.2 kg   SpO2 100%   BMI 28.06 kg/m  Physical Exam Vitals and nursing note reviewed.  Constitutional:      General: He is not in acute distress.    Appearance: He is not ill-appearing.  HENT:     Head: Normocephalic.  Eyes:     Pupils: Pupils are equal, round, and reactive to light.  Cardiovascular:     Rate and Rhythm: Normal rate and regular rhythm.     Pulses: Normal pulses.     Heart sounds: Normal heart sounds. No murmur heard.    No friction rub. No gallop.  Pulmonary:     Effort: Pulmonary effort is normal.     Breath sounds: Normal breath sounds.  Abdominal:     General: Abdomen is flat. There is no distension.     Palpations: Abdomen is soft.     Tenderness: There is no abdominal tenderness. There is no guarding or rebound.  Musculoskeletal:        General:  Normal range of motion.     Cervical back: Neck supple.     Comments: Decreased ROM of right arm due to pain. Some mild tenderness over pectoralis. No gross deformity. RUE neurovascularly intact.   Skin:    General: Skin is warm and dry.  Neurological:     General: No focal deficit present.     Mental Status: He is alert.  Psychiatric:        Mood and Affect: Mood normal.        Behavior: Behavior normal.     ED Results / Procedures / Treatments   Labs (all labs ordered are listed, but only abnormal results are displayed) Labs Reviewed - No data to display  EKG None  Radiology DG Chest [facility] 1 View  Result Date: 01/27/2022 CLINICAL DATA:  Chest pain EXAM: PORTABLE CHEST 1 VIEW COMPARISON:  12/14/2018 FINDINGS: The heart size  and mediastinal contours are within normal limits. Both lungs are clear. The visualized skeletal structures are unremarkable. IMPRESSION: Normal chest x-ray. Electronically Signed   By: Davina Poke D.O.   On: 01/27/2022 18:14    Procedures Procedures    Medications Ordered in ED Medications  ibuprofen (ADVIL) tablet 800 mg (has no administration in time range)    ED Course/ Medical Decision Making/ A&P                           Medical Decision Making Amount and/or Complexity of Data Reviewed Radiology: ordered and independent interpretation performed. Decision-making details documented in ED Course. ECG/medicine tests: ordered.  Risk Prescription drug management.   46 year old male presents to the ED due to pain in right pectoralis muscle after bench pressing.  He heard a pop and noticed a slight deformity.  He admits to pain with overhead motion.  Upon arrival, stable vitals.  Patient in no acute distress.  Physical exam significant for slight tenderness over right pectoralis muscle.  No gross deformity.  Right upper extremity neurovascularly intact with soft compartments.  Low suspicion for compartment syndrome. Possible rupture vs. Partial rupture vs. Strain.  Chest x-ray ordered at triage which I personally reviewed and interpreted which is negative for signs of pneumonia, pneumothorax or widened mediastinum. EKG demonstrates NSR. No signs of acute ischemia. Low suspicion for cardiac etiology of pain. Patient treated with ibuprofen here in the ED. Orthopedics referral given to patient at discharge and advised to call to schedule an appointment for further evaluation. Patient able to still move RUE, so lower suspicion for full rupture. Patient discharged with naproxen. Strict ED precautions discussed with patient. Patient states understanding and agrees to plan. Patient discharged home in no acute distress and stable vitals  Has PCP Hx. HL and asthma       Final Clinical  Impression(s) / ED Diagnoses Final diagnoses:  Strain of right pectoralis muscle, initial encounter    Rx / DC Orders ED Discharge Orders          Ordered    naproxen (NAPROSYN) 500 MG tablet  2 times daily        01/27/22 1839              Karie Kirks 01/27/22 [place], Dan, DO 01/27/22 1912

## 2022-01-27 NOTE — ED Notes (Signed)
Discharge paperwork given and verbally understood. 

## 2022-01-27 NOTE — ED Triage Notes (Signed)
Pt was working out and while bench pressing weight felt a pop in right pectoral muscle. Pt complains of pain when lifting right arm. Pt states pain radiates down right arm into fingers pain was worse when trying to type on phone.

## 2022-01-27 NOTE — Discharge Instructions (Signed)
It was a pleasure taking care of you today.  As discussed, I have included the number of the orthopedic surgeon.  Please call tomorrow to schedule an appointment for further evaluation.  I am sending you home with pain medication.  Take as needed for pain.  Continue to ice and elevate your right arm.  Return to the ER for any worsening symptoms.

## 2022-02-04 ENCOUNTER — Ambulatory Visit (INDEPENDENT_AMBULATORY_CARE_PROVIDER_SITE_OTHER): Payer: No Typology Code available for payment source | Admitting: Orthopaedic Surgery

## 2022-02-04 DIAGNOSIS — S29011A Strain of muscle and tendon of front wall of thorax, initial encounter: Secondary | ICD-10-CM | POA: Diagnosis not present

## 2022-02-04 NOTE — Progress Notes (Signed)
Chief Complaint: Right pectoralis major tear     History of Present Illness:    Brandon Lawson is a 47 y.o. 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  Since this time he did note bruising in the anterior aspect of the shoulder with a pectoralis deformity compared to the other side.  He has been resting this.  He has been out of work.  This is a workers comp injury.  He works as a Airline pilot.  He does enjoy being active and has 2 children in college.    Surgical History:   None  PMH/PSH/Family History/Social History/Meds/Allergies:    Past Medical History:  Diagnosis Date   Asthma    Hyperlipidemia    No past surgical history on file. Social History   Socioeconomic History   Marital status: Married    Spouse name: Not on file   Number of children: 3   Years of education: Not on file   Highest education level: Bachelor's degree (e.g., BA, AB, BS)  Occupational History   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PT    Employer: [employer]  Tobacco Use   Smoking status: Former   Smokeless tobacco: Current    Types: Chew   Tobacco comments:    every now and then   Vaping Use   Vaping Use: Unknown  Substance and Sexual Activity   Alcohol use: Yes    Alcohol/week: 7.0 standard drinks of alcohol    Types: 7 Glasses of wine per week    Comment: Socially    Drug use: No   Sexual activity: Not on file  Other Topics Concern   Not on file  Social History Narrative   Not on file   Social Determinants of Health   Financial Resource Strain: Not on file  Food Insecurity: Not on file  Transportation Needs: Not on file  Physical Activity: Sufficiently Active (04/14/2017)   Exercise Vital Sign    Days of Exercise per Week: 5 days    Minutes of Exercise per Session: 30 min  Stress: Not on file  Social Connections: Not on  file   Family History  Problem Relation Age of Onset   Hypertension Mother    Hyperlipidemia Mother    Heart attack Father    Heart disease Father    Coronary artery disease Father    Heart attack Maternal Grandmother    No Known Allergies Current Outpatient Medications  Medication Sig Dispense Refill   cetirizine-pseudoephedrine (ZYRTEC-D) 5-120 MG tablet Take 1 tablet by mouth daily. 30 tablet 0   fluticasone (FLONASE) 50 MCG/ACT nasal spray Place 2 sprays into both nostrils daily. 15.8 g 1   Multiple Vitamin (MULTIVITAMIN WITH MINERALS) TABS tablet Take 1 tablet by mouth daily.     naphazoline-glycerin (CLEAR EYES REDNESS) 0.012-0.2 % SOLN Place 1-2 drops into both eyes 4 (four) times daily as needed for eye irritation.     naproxen (NAPROSYN) 500 MG tablet Take 1 tablet (500 mg total) by mouth 2 (two) times daily. 30 tablet 0   Omega-3 1000 MG CAPS Take 2 capsules by mouth daily.      VENTOLIN HFA 108 (90 Base) MCG/ACT  inhaler Inhale 1-2 puffs into the lungs every 4 (four) hours as needed for wheezing or shortness of breath.     No current facility-administered medications for this visit.   No results found.  Review of Systems:   A ROS was performed including pertinent positives and negatives as documented in the HPI.  Physical Exam :   Constitutional: NAD and appears stated age Neurological: Alert and oriented Psych: Appropriate affect and cooperative There were no vitals taken for this visit.   Comprehensive Musculoskeletal Exam:    Tenderness to palpation with palpable defect at the pectoralis insertion.  There is asymmetry of the pectoralis on the right compared to the left.  He has got pain with internal rotation of the right arm.  He does have bruising down the arm.  Remainder of his neurosensory exam is intact  Imaging:    I personally reviewed and interpreted the radiographs.   Assessment:   47 y.o. male with a right pectoralis major tear.  At this time I  described that I would like to obtain a MRI so that we can assess it which portion of the pectoralis his injury has occurred.  I will plan to see him back following his MRI and we will go over the results.  We did briefly discuss surgical intervention given his high activity level.  I will plan to see him back after his MRI and we will discuss results  Plan :    -Plan for MRI and follow-up to discuss results     I personally saw and evaluated the patient, and participated in the management and treatment plan.  Vanetta Mulders, MD Attending Physician, Orthopedic Surgery  This document was dictated using Dragon voice recognition software. A reasonable attempt at proof reading has been made to minimize errors.

## 2022-02-08 ENCOUNTER — Ambulatory Visit
Admission: RE | Admit: 2022-02-08 | Discharge: 2022-02-08 | Disposition: A | Discharge status: Home / Self Care | Payer: 59 | Source: Ambulatory Visit | Attending: Orthopaedic Surgery | Admitting: Orthopaedic Surgery

## 2022-02-08 ENCOUNTER — Other Ambulatory Visit: Payer: 59

## 2022-02-08 DIAGNOSIS — S29011A Strain of muscle and tendon of front wall of thorax, initial encounter: Secondary | ICD-10-CM

## 2022-02-09 ENCOUNTER — Other Ambulatory Visit (HOSPITAL_BASED_OUTPATIENT_CLINIC_OR_DEPARTMENT_OTHER): Payer: Self-pay

## 2022-02-09 ENCOUNTER — Ambulatory Visit (HOSPITAL_BASED_OUTPATIENT_CLINIC_OR_DEPARTMENT_OTHER): Payer: Self-pay | Admitting: Orthopaedic Surgery

## 2022-02-09 ENCOUNTER — Other Ambulatory Visit: Payer: Self-pay

## 2022-02-09 ENCOUNTER — Ambulatory Visit (INDEPENDENT_AMBULATORY_CARE_PROVIDER_SITE_OTHER): Payer: No Typology Code available for payment source | Admitting: Orthopaedic Surgery

## 2022-02-09 DIAGNOSIS — S29011A Strain of muscle and tendon of front wall of thorax, initial encounter: Secondary | ICD-10-CM

## 2022-02-09 MED ORDER — OXYCODONE HCL 5 MG PO TABS
5.0000 mg | ORAL_TABLET | ORAL | 0 refills | Status: AC | PRN
  Filled 2022-02-09: qty 20, 4d supply, fill #0

## 2022-02-09 MED ORDER — ASPIRIN 325 MG PO TBEC
325.0000 mg | DELAYED_RELEASE_TABLET | Freq: Every day | ORAL | 0 refills | Status: AC
  Filled 2022-02-09: qty 30, 30d supply, fill #0

## 2022-02-09 MED ORDER — IBUPROFEN 800 MG PO TABS
800.0000 mg | ORAL_TABLET | Freq: Three times a day (TID) | ORAL | 0 refills | Status: AC
  Filled 2022-02-09: qty 30, 10d supply, fill #0

## 2022-02-09 MED ORDER — ACETAMINOPHEN 500 MG PO TABS
500.0000 mg | ORAL_TABLET | Freq: Three times a day (TID) | ORAL | 0 refills | Status: AC
  Filled 2022-02-09: qty 30, 10d supply, fill #0

## 2022-02-09 NOTE — H&P (View-Only) (Signed)
Chief Complaint: Right pectoralis major tear     History of Present Illness:   02/09/2022: Presents today for MRI follow-up of his right pectoralis.  He is having persistent pain and weakness in the right shoulder.  He is here today for further assessment  Brandon Lawson is a 47 y.o. 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  Since this time he did note bruising in the anterior aspect of the shoulder with a pectoralis deformity compared to the other side.  He has been resting this.  He has been out of work.  This is a workers comp injury.  He works as a Airline pilot.  He does enjoy being active and has 2 children in college.    Surgical History:   None  PMH/PSH/Family History/Social History/Meds/Allergies:    Past Medical History:  Diagnosis Date   Asthma    Hyperlipidemia    No past surgical history on file. Social History   Socioeconomic History   Marital status: Married    Spouse name: Not on file   Number of children: 3   Years of education: Not on file   Highest education level: Bachelor's degree (e.g., BA, AB, BS)  Occupational History   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PT    Employer: [employer]  Tobacco Use   Smoking status: Former   Smokeless tobacco: Current    Types: Chew   Tobacco comments:    every now and then   Vaping Use   Vaping Use: Unknown  Substance and Sexual Activity   Alcohol use: Yes    Alcohol/week: 7.0 standard drinks of alcohol    Types: 7 Glasses of wine per week    Comment: Socially    Drug use: No   Sexual activity: Not on file  Other Topics Concern   Not on file  Social History Narrative   Not on file   Social Determinants of Health   Financial Resource Strain: Not on file  Food Insecurity: Not on file  Transportation Needs: Not on file  Physical Activity:  Sufficiently Active (04/14/2017)   Exercise Vital Sign    Days of Exercise per Week: 5 days    Minutes of Exercise per Session: 30 min  Stress: Not on file  Social Connections: Not on file   Family History  Problem Relation Age of Onset   Hypertension Mother    Hyperlipidemia Mother    Heart attack Father    Heart disease Father    Coronary artery disease Father    Heart attack Maternal Grandmother    No Known Allergies Current Outpatient Medications  Medication Sig Dispense Refill   acetaminophen (TYLENOL) 500 MG tablet Take 1 tablet (500 mg total) by mouth every 8 (eight) hours for 10 days. 30 tablet 0   aspirin EC 325 MG tablet Take 1 tablet (325 mg total) by mouth daily. 30 tablet 0   ibuprofen (ADVIL) 800 MG tablet Take 1 tablet (800 mg total) by mouth every 8 (eight) hours for 10 days. Please take with food, please alternate with acetaminophen 30 tablet 0   oxyCODONE (OXY IR/ROXICODONE) 5 MG immediate release tablet Take 1 tablet (5 mg  total) by mouth every 4 (four) hours as needed (severe pain). 20 tablet 0   cetirizine-pseudoephedrine (ZYRTEC-D) 5-120 MG tablet Take 1 tablet by mouth daily. 30 tablet 0   fluticasone (FLONASE) 50 MCG/ACT nasal spray Place 2 sprays into both nostrils daily. 15.8 g 1   Multiple Vitamin (MULTIVITAMIN WITH MINERALS) TABS tablet Take 1 tablet by mouth daily.     naphazoline-glycerin (CLEAR EYES REDNESS) 0.012-0.2 % SOLN Place 1-2 drops into both eyes 4 (four) times daily as needed for eye irritation.     naproxen (NAPROSYN) 500 MG tablet Take 1 tablet (500 mg total) by mouth 2 (two) times daily. 30 tablet 0   Omega-3 1000 MG CAPS Take 2 capsules by mouth daily.      VENTOLIN HFA 108 (90 Base) MCG/ACT inhaler Inhale 1-2 puffs into the lungs every 4 (four) hours as needed for wheezing or shortness of breath.     No current facility-administered medications for this visit.   MR CHEST WO CONTRAST  Result Date: 02/08/2022 CLINICAL DATA:  Pectoralis  muscle pain.  Weight lifting injury EXAM: MR CHEST WITHOUT CONTRAST TECHNIQUE: Multiplanar, multisequence MR imaging of the right chest wall was performed. No intravenous contrast was administered. COMPARISON:  None Available. FINDINGS: Bones/Joint/Cartilage: Osseous structures are intact without fracture or malalignment. There is no bone marrow edema. No marrow replacing bone lesion. Ligaments: Intact. Muscles and Tendons: Complete tear of the distal tendon of the right pectoralis major muscle with medial retraction of the torn tendon stump by approximately 8 cm (series 4, image 19; series 7, image 19). There is mild intramuscular edema at the myotendinous junction of the sternal belly of the pectoralis major muscle. Evolving hematoma surrounding the torn tendon stump with intermediate to low T1 signal. Collection measures approximately 5.7 x 4.6 x 4.6 cm (volume = 63 cm^3). Preserved muscle bulk without atrophy or fatty infiltration. Remaining visualized myotendinous structures appear within normal limits. Soft tissue: No adenopathy or mass lesion of the chest wall or axilla. IMPRESSION: 1. Complete tear of the distal tendon of the right pectoralis major muscle with medial retraction of approximately 8 cm. 2. Evolving hematoma surrounding the torn tendon stump measuring approximately 5.7 x 4.6 x 4.6 cm. Electronically Signed   By: Duanne Guess D.O.   On: 02/08/2022 13:11    Review of Systems:   A ROS was performed including pertinent positives and negatives as documented in the HPI.  Physical Exam :   Constitutional: NAD and appears stated age Neurological: Alert and oriented Psych: Appropriate affect and cooperative There were no vitals taken for this visit.   Comprehensive Musculoskeletal Exam:    Tenderness to palpation with palpable defect at the pectoralis insertion.  There is asymmetry of the pectoralis on the right compared to the left.  He has got pain with internal rotation of the right  arm.  He does have bruising down the arm.  Remainder of his neurosensory exam is intact  Imaging:    MRI right shoulder: There is a complete tear and avulsion of the pectoralis major tendon with retraction  I personally reviewed and interpreted the radiographs.   Assessment:   47 y.o. male with a right pectoralis major tear.  At this time I did discuss treatment options given the fact he does have a full-thickness pectoralis major tear with retraction.  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  I did  discuss that this would likely result in symptomatic weakness and pain in the shoulder.  Side effect we did discuss surgical management with pectoralis major repair.  I did describe that to me his tear does appear to be somewhat more at the musculotendinous junction that will require significant suturing in order to improve the strength of the repair.  He understands this.  After further discussion of risks and benefits he is elected for right pectoralis major repair.  Plan :    -Plan for right shoulder pectoralis major repair   After a lengthy discussion of treatment options, including risks, benefits, alternatives, complications of surgical and nonsurgical conservative options, the patient elected surgical repair.   The patient  is aware of the material risks  and complications including, but not limited to injury to adjacent structures, neurovascular injury, infection, numbness, bleeding, implant failure, thermal burns, stiffness, persistent pain, failure to heal, disease transmission from allograft, need for further surgery, dislocation, anesthetic risks, blood clots, risks of death,and others. The probabilities of surgical success and failure discussed with patient given their particular co-morbidities.The time and nature of expected rehabilitation and recovery was discussed.The patient's  questions were all answered preoperatively.  No barriers to understanding were noted. I explained the natural history of the disease process and Rx rationale.  I explained to the patient what I considered to be reasonable expectations given their personal situation.  The final treatment plan was arrived at through a shared patient decision making process model.      I personally saw and evaluated the patient, and participated in the management and treatment plan.  Vanetta Mulders, MD Attending Physician, Orthopedic Surgery  This document was dictated using Dragon voice recognition software. A reasonable attempt at proof reading has been made to minimize errors.

## 2022-02-09 NOTE — Progress Notes (Signed)
Chief Complaint: Right pectoralis major tear     History of Present Illness:   02/09/2022: Presents today for MRI follow-up of his right pectoralis.  He is having persistent pain and weakness in the right shoulder.  He is here today for further assessment  Brandon CASHER is a 47 y.o. 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  Since this time he did note bruising in the anterior aspect of the shoulder with a pectoralis deformity compared to the other side.  He has been resting this.  He has been out of work.  This is a workers comp injury.  He works as a Airline pilot.  He does enjoy being active and has 2 children in college.    Surgical History:   None  PMH/PSH/Family History/Social History/Meds/Allergies:    Past Medical History:  Diagnosis Date   Asthma    Hyperlipidemia    No past surgical history on file. Social History   Socioeconomic History   Marital status: Married    Spouse name: Not on file   Number of children: 3   Years of education: Not on file   Highest education level: Bachelor's degree (e.g., BA, AB, BS)  Occupational History   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PT    Employer: [employer]  Tobacco Use   Smoking status: Former   Smokeless tobacco: Current    Types: Chew   Tobacco comments:    every now and then   Vaping Use   Vaping Use: Unknown  Substance and Sexual Activity   Alcohol use: Yes    Alcohol/week: 7.0 standard drinks of alcohol    Types: 7 Glasses of wine per week    Comment: Socially    Drug use: No   Sexual activity: Not on file  Other Topics Concern   Not on file  Social History Narrative   Not on file   Social Determinants of Health   Financial Resource Strain: Not on file  Food Insecurity: Not on file  Transportation Needs: Not on file  Physical Activity:  Sufficiently Active (04/14/2017)   Exercise Vital Sign    Days of Exercise per Week: 5 days    Minutes of Exercise per Session: 30 min  Stress: Not on file  Social Connections: Not on file   Family History  Problem Relation Age of Onset   Hypertension Mother    Hyperlipidemia Mother    Heart attack Father    Heart disease Father    Coronary artery disease Father    Heart attack Maternal Grandmother    No Known Allergies Current Outpatient Medications  Medication Sig Dispense Refill   acetaminophen (TYLENOL) 500 MG tablet Take 1 tablet (500 mg total) by mouth every 8 (eight) hours for 10 days. 30 tablet 0   aspirin EC 325 MG tablet Take 1 tablet (325 mg total) by mouth daily. 30 tablet 0   ibuprofen (ADVIL) 800 MG tablet Take 1 tablet (800 mg total) by mouth every 8 (eight) hours for 10 days. Please take with food, please alternate with acetaminophen 30 tablet 0   oxyCODONE (OXY IR/ROXICODONE) 5 MG immediate release tablet Take 1 tablet (5 mg  total) by mouth every 4 (four) hours as needed (severe pain). 20 tablet 0   cetirizine-pseudoephedrine (ZYRTEC-D) 5-120 MG tablet Take 1 tablet by mouth daily. 30 tablet 0   fluticasone (FLONASE) 50 MCG/ACT nasal spray Place 2 sprays into both nostrils daily. 15.8 g 1   Multiple Vitamin (MULTIVITAMIN WITH MINERALS) TABS tablet Take 1 tablet by mouth daily.     naphazoline-glycerin (CLEAR EYES REDNESS) 0.012-0.2 % SOLN Place 1-2 drops into both eyes 4 (four) times daily as needed for eye irritation.     naproxen (NAPROSYN) 500 MG tablet Take 1 tablet (500 mg total) by mouth 2 (two) times daily. 30 tablet 0   Omega-3 1000 MG CAPS Take 2 capsules by mouth daily.      VENTOLIN HFA 108 (90 Base) MCG/ACT inhaler Inhale 1-2 puffs into the lungs every 4 (four) hours as needed for wheezing or shortness of breath.     No current facility-administered medications for this visit.   MR CHEST WO CONTRAST  Result Date: 02/08/2022 CLINICAL DATA:  Pectoralis  muscle pain.  Weight lifting injury EXAM: MR CHEST WITHOUT CONTRAST TECHNIQUE: Multiplanar, multisequence MR imaging of the right chest wall was performed. No intravenous contrast was administered. COMPARISON:  None Available. FINDINGS: Bones/Joint/Cartilage: Osseous structures are intact without fracture or malalignment. There is no bone marrow edema. No marrow replacing bone lesion. Ligaments: Intact. Muscles and Tendons: Complete tear of the distal tendon of the right pectoralis major muscle with medial retraction of the torn tendon stump by approximately 8 cm (series 4, image 19; series 7, image 19). There is mild intramuscular edema at the myotendinous junction of the sternal belly of the pectoralis major muscle. Evolving hematoma surrounding the torn tendon stump with intermediate to low T1 signal. Collection measures approximately 5.7 x 4.6 x 4.6 cm (volume = 63 cm^3). Preserved muscle bulk without atrophy or fatty infiltration. Remaining visualized myotendinous structures appear within normal limits. Soft tissue: No adenopathy or mass lesion of the chest wall or axilla. IMPRESSION: 1. Complete tear of the distal tendon of the right pectoralis major muscle with medial retraction of approximately 8 cm. 2. Evolving hematoma surrounding the torn tendon stump measuring approximately 5.7 x 4.6 x 4.6 cm. Electronically Signed   By: Nicholas  Plundo D.O.   On: 02/08/2022 13:11    Review of Systems:   A ROS was performed including pertinent positives and negatives as documented in the HPI.  Physical Exam :   Constitutional: NAD and appears stated age Neurological: Alert and oriented Psych: Appropriate affect and cooperative There were no vitals taken for this visit.   Comprehensive Musculoskeletal Exam:    Tenderness to palpation with palpable defect at the pectoralis insertion.  There is asymmetry of the pectoralis on the right compared to the left.  He has got pain with internal rotation of the right  arm.  He does have bruising down the arm.  Remainder of his neurosensory exam is intact  Imaging:    MRI right shoulder: There is a complete tear and avulsion of the pectoralis major tendon with retraction  I personally reviewed and interpreted the radiographs.   Assessment:   46 y.o. male with a right pectoralis major tear.  At this time I did discuss treatment options given the fact he does have a full-thickness pectoralis major tear with retraction.  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  I did   discuss that this would likely result in symptomatic weakness and pain in the shoulder.  Side effect we did discuss surgical management with pectoralis major repair.  I did describe that to me his tear does appear to be somewhat more at the musculotendinous junction that will require significant suturing in order to improve the strength of the repair.  He understands this.  After further discussion of risks and benefits he is elected for right pectoralis major repair.  Plan :    -Plan for right shoulder pectoralis major repair   After a lengthy discussion of treatment options, including risks, benefits, alternatives, complications of surgical and nonsurgical conservative options, the patient elected surgical repair.   The patient  is aware of the material risks  and complications including, but not limited to injury to adjacent structures, neurovascular injury, infection, numbness, bleeding, implant failure, thermal burns, stiffness, persistent pain, failure to heal, disease transmission from allograft, need for further surgery, dislocation, anesthetic risks, blood clots, risks of death,and others. The probabilities of surgical success and failure discussed with patient given their particular co-morbidities.The time and nature of expected rehabilitation and recovery was discussed.The patient's  questions were all answered preoperatively.  No barriers to understanding were noted. I explained the natural history of the disease process and Rx rationale.  I explained to the patient what I considered to be reasonable expectations given their personal situation.  The final treatment plan was arrived at through a shared patient decision making process model.      I personally saw and evaluated the patient, and participated in the management and treatment plan.  Vanetta Mulders, MD Attending Physician, Orthopedic Surgery  This document was dictated using Dragon voice recognition software. A reasonable attempt at proof reading has been made to minimize errors.

## 2022-02-17 ENCOUNTER — Telehealth: Payer: Self-pay | Admitting: Orthopaedic Surgery

## 2022-02-17 NOTE — Telephone Encounter (Signed)
I spoke with [place] case manager, Argentina Donovan. He emailed auth for surgery. I forwarded to April B. His callback [phone], fax [phone].

## 2022-02-22 ENCOUNTER — Encounter (HOSPITAL_COMMUNITY): Payer: Self-pay | Admitting: Orthopaedic Surgery

## 2022-02-22 NOTE — Progress Notes (Signed)
PCP - Dr Benita Stabile Cardiologist - n/a  Chest x-ray - 01/27/22 EKG - 02/09/22 Stress Test - 04/18/17 ECHO - 04/20/17 Cardiac Cath - n/a  ICD Pacemaker/Loop - n/a  Sleep Study -  n/a CPAP - none  Aspirin Instructions: Follow your surgeon's instructions on when to stop aspirin prior to surgery,  If no instructions were given by your surgeon then you will need to call the office for those instructions.  ERAS: Clear liquids til 9:50 AM DOS.  STOP now taking any Aspirin (unless otherwise instructed by your surgeon), Aleve, Naproxen, Ibuprofen, Motrin, Advil, Goody's, BC's, all herbal medications, fish oil, and all vitamins.   Coronavirus Screening Do you have any of the following symptoms:  Cough yes/no: No Fever (>100.32F)  yes/no: No Runny nose yes/no: No Sore throat yes/no: No Difficulty breathing/shortness of breath  yes/no: No  Have you traveled in the last 14 days and where? yes/no: No  Patient verbalized understanding of instructions that were given via phone.

## 2022-02-24 ENCOUNTER — Other Ambulatory Visit (HOSPITAL_BASED_OUTPATIENT_CLINIC_OR_DEPARTMENT_OTHER): Payer: Self-pay | Admitting: Orthopaedic Surgery

## 2022-02-24 ENCOUNTER — Encounter (HOSPITAL_COMMUNITY)
Admission: RE | Disposition: A | Discharge status: Home / Self Care | Payer: Self-pay | Source: Home / Self Care | Attending: Orthopaedic Surgery

## 2022-02-24 ENCOUNTER — Telehealth: Payer: Self-pay | Admitting: Orthopaedic Surgery

## 2022-02-24 ENCOUNTER — Ambulatory Visit (HOSPITAL_COMMUNITY): Payer: No Typology Code available for payment source | Admitting: Anesthesiology

## 2022-02-24 ENCOUNTER — Ambulatory Visit (HOSPITAL_COMMUNITY)
Admission: RE | Admit: 2022-02-24 | Discharge: 2022-02-24 | Disposition: A | Discharge status: Home / Self Care | Payer: No Typology Code available for payment source | Attending: Orthopaedic Surgery | Admitting: Orthopaedic Surgery

## 2022-02-24 ENCOUNTER — Other Ambulatory Visit: Payer: Self-pay

## 2022-02-24 ENCOUNTER — Encounter (HOSPITAL_COMMUNITY): Payer: Self-pay | Admitting: Orthopaedic Surgery

## 2022-02-24 ENCOUNTER — Ambulatory Visit (HOSPITAL_BASED_OUTPATIENT_CLINIC_OR_DEPARTMENT_OTHER): Payer: No Typology Code available for payment source | Admitting: Anesthesiology

## 2022-02-24 DIAGNOSIS — Z87891 Personal history of nicotine dependence: Secondary | ICD-10-CM | POA: Insufficient documentation

## 2022-02-24 DIAGNOSIS — S29011A Strain of muscle and tendon of front wall of thorax, initial encounter: Secondary | ICD-10-CM

## 2022-02-24 DIAGNOSIS — E785 Hyperlipidemia, unspecified: Secondary | ICD-10-CM | POA: Insufficient documentation

## 2022-02-24 DIAGNOSIS — J45909 Unspecified asthma, uncomplicated: Secondary | ICD-10-CM | POA: Insufficient documentation

## 2022-02-24 DIAGNOSIS — X509XXA Other and unspecified overexertion or strenuous movements or postures, initial encounter: Secondary | ICD-10-CM | POA: Diagnosis not present

## 2022-02-24 DIAGNOSIS — Y9289 Other specified places as the place of occurrence of the external cause: Secondary | ICD-10-CM | POA: Insufficient documentation

## 2022-02-24 HISTORY — PX: PECTORALIS TENDON REPAIR: SHX6510

## 2022-02-24 HISTORY — DX: Hyperlipidemia, unspecified: E78.5

## 2022-02-24 LAB — CBC
HCT: 46 % (ref 39.0–52.0)
Hemoglobin: 15.8 g/dL (ref 13.0–17.0)
MCH: 31 pg (ref 26.0–34.0)
MCHC: 34.3 g/dL (ref 30.0–36.0)
MCV: 90.4 fL (ref 80.0–100.0)
Platelets: 240 10*3/uL (ref 150–400)
RBC: 5.09 MIL/uL (ref 4.22–5.81)
RDW: 11.9 % (ref 11.5–15.5)
WBC: 4.1 10*3/uL (ref 4.0–10.5)
nRBC: 0 % (ref 0.0–0.2)

## 2022-02-24 SURGERY — REPAIR, TENDON, PECTORALIS
Anesthesia: General | Site: Chest | Laterality: Right

## 2022-02-24 MED ORDER — DEXAMETHASONE SODIUM PHOSPHATE 10 MG/ML IJ SOLN
INTRAMUSCULAR | Status: DC | PRN
  Administered 2022-02-24: 10 mg via INTRAVENOUS

## 2022-02-24 MED ORDER — TRANEXAMIC ACID-NACL 1000-0.7 MG/100ML-% IV SOLN
1000.0000 mg | INTRAVENOUS | Status: AC
  Administered 2022-02-24: 1000 mg via INTRAVENOUS
  Filled 2022-02-24: qty 100

## 2022-02-24 MED ORDER — DEXAMETHASONE SODIUM PHOSPHATE 10 MG/ML IJ SOLN
INTRAMUSCULAR | Status: AC
  Filled 2022-02-24: qty 1

## 2022-02-24 MED ORDER — OXYCODONE HCL 5 MG PO TABS
5.0000 mg | ORAL_TABLET | Freq: Once | ORAL | Status: AC | PRN
  Administered 2022-02-24: 5 mg via ORAL

## 2022-02-24 MED ORDER — HYDROMORPHONE HCL 1 MG/ML IJ SOLN
0.2500 mg | INTRAMUSCULAR | Status: DC | PRN
  Administered 2022-02-24 (×3): 0.5 mg via INTRAVENOUS

## 2022-02-24 MED ORDER — CHLORHEXIDINE GLUCONATE 0.12 % MT SOLN
15.0000 mL | Freq: Once | OROMUCOSAL | Status: AC
  Administered 2022-02-24: 15 mL via OROMUCOSAL
  Filled 2022-02-24: qty 15

## 2022-02-24 MED ORDER — ACETAMINOPHEN 500 MG PO TABS
1000.0000 mg | ORAL_TABLET | Freq: Once | ORAL | Status: AC
  Administered 2022-02-24: 1000 mg via ORAL
  Filled 2022-02-24: qty 2

## 2022-02-24 MED ORDER — MORPHINE SULFATE (PF) 4 MG/ML IV SOLN
INTRAVENOUS | Status: AC
  Filled 2022-02-24: qty 1

## 2022-02-24 MED ORDER — ONDANSETRON HCL 4 MG/2ML IJ SOLN: 4.0000 mg | Freq: Once | INTRAMUSCULAR | Status: DC | PRN

## 2022-02-24 MED ORDER — ONDANSETRON HCL 4 MG/2ML IJ SOLN
INTRAMUSCULAR | Status: AC
  Filled 2022-02-24: qty 2

## 2022-02-24 MED ORDER — PHENYLEPHRINE 80 MCG/ML (10ML) SYRINGE FOR IV PUSH (FOR BLOOD PRESSURE SUPPORT)
PREFILLED_SYRINGE | INTRAVENOUS | Status: DC | PRN
  Administered 2022-02-24 (×3): 160 ug via INTRAVENOUS

## 2022-02-24 MED ORDER — FENTANYL CITRATE (PF) 250 MCG/5ML IJ SOLN
INTRAMUSCULAR | Status: AC
  Filled 2022-02-24: qty 5

## 2022-02-24 MED ORDER — BUPIVACAINE HCL (PF) 0.25 % IJ SOLN
INTRAMUSCULAR | Status: AC
  Filled 2022-02-24: qty 30

## 2022-02-24 MED ORDER — PROPOFOL 10 MG/ML IV BOLUS
INTRAVENOUS | Status: AC
  Filled 2022-02-24: qty 20

## 2022-02-24 MED ORDER — DEXMEDETOMIDINE HCL IN NACL 200 MCG/50ML IV SOLN
INTRAVENOUS | Status: DC | PRN
  Administered 2022-02-24 (×2): 4 ug via INTRAVENOUS

## 2022-02-24 MED ORDER — BUPIVACAINE HCL (PF) 0.25 % IJ SOLN
INTRAMUSCULAR | Status: DC | PRN
  Administered 2022-02-24: 10 mL

## 2022-02-24 MED ORDER — ROCURONIUM BROMIDE 10 MG/ML (PF) SYRINGE
PREFILLED_SYRINGE | INTRAVENOUS | Status: DC | PRN
  Administered 2022-02-24: 80 mg via INTRAVENOUS

## 2022-02-24 MED ORDER — LIDOCAINE 2% (20 MG/ML) 5 ML SYRINGE
INTRAMUSCULAR | Status: DC | PRN
  Administered 2022-02-24: 80 mg via INTRAVENOUS

## 2022-02-24 MED ORDER — VANCOMYCIN HCL 1000 MG IV SOLR
INTRAVENOUS | Status: AC
  Filled 2022-02-24: qty 20

## 2022-02-24 MED ORDER — EPHEDRINE 5 MG/ML INJ
INTRAVENOUS | Status: AC
  Filled 2022-02-24: qty 5

## 2022-02-24 MED ORDER — FENTANYL CITRATE (PF) 250 MCG/5ML IJ SOLN
INTRAMUSCULAR | Status: DC | PRN
  Administered 2022-02-24: 200 ug via INTRAVENOUS
  Administered 2022-02-24: 50 ug via INTRAVENOUS

## 2022-02-24 MED ORDER — ONDANSETRON HCL 4 MG/2ML IJ SOLN
INTRAMUSCULAR | Status: DC | PRN
  Administered 2022-02-24: 4 mg via INTRAVENOUS

## 2022-02-24 MED ORDER — ACETAMINOPHEN 10 MG/ML IV SOLN
INTRAVENOUS | Status: AC
  Filled 2022-02-24: qty 100

## 2022-02-24 MED ORDER — PHENYLEPHRINE 80 MCG/ML (10ML) SYRINGE FOR IV PUSH (FOR BLOOD PRESSURE SUPPORT)
PREFILLED_SYRINGE | INTRAVENOUS | Status: AC
  Filled 2022-02-24: qty 10

## 2022-02-24 MED ORDER — SUGAMMADEX SODIUM 200 MG/2ML IV SOLN
INTRAVENOUS | Status: DC | PRN
  Administered 2022-02-24: 400 mg via INTRAVENOUS

## 2022-02-24 MED ORDER — ORAL CARE MOUTH RINSE: 15.0000 mL | Freq: Once | OROMUCOSAL | Status: AC

## 2022-02-24 MED ORDER — MIDAZOLAM HCL 2 MG/2ML IJ SOLN
INTRAMUSCULAR | Status: AC
  Filled 2022-02-24: qty 2

## 2022-02-24 MED ORDER — CEFAZOLIN SODIUM-DEXTROSE 2-4 GM/100ML-% IV SOLN
2.0000 g | INTRAVENOUS | Status: AC
  Administered 2022-02-24: 2 g via INTRAVENOUS
  Filled 2022-02-24: qty 100

## 2022-02-24 MED ORDER — PROPOFOL 10 MG/ML IV BOLUS
INTRAVENOUS | Status: DC | PRN
  Administered 2022-02-24: 160 mg via INTRAVENOUS

## 2022-02-24 MED ORDER — OXYCODONE HCL 5 MG PO TABS
ORAL_TABLET | ORAL | Status: AC
  Filled 2022-02-24: qty 1

## 2022-02-24 MED ORDER — MIDAZOLAM HCL 2 MG/2ML IJ SOLN
INTRAMUSCULAR | Status: DC | PRN
  Administered 2022-02-24: 2 mg via INTRAVENOUS

## 2022-02-24 MED ORDER — ACETAMINOPHEN 10 MG/ML IV SOLN
INTRAVENOUS | Status: DC | PRN
  Administered 2022-02-24: 1000 mg via INTRAVENOUS

## 2022-02-24 MED ORDER — OXYCODONE HCL 5 MG/5ML PO SOLN: 5.0000 mg | Freq: Once | ORAL | Status: AC | PRN

## 2022-02-24 MED ORDER — VANCOMYCIN HCL 1000 MG IV SOLR
INTRAVENOUS | Status: DC | PRN
  Administered 2022-02-24: 1000 mg

## 2022-02-24 MED ORDER — EPHEDRINE SULFATE-NACL 50-0.9 MG/10ML-% IV SOSY
PREFILLED_SYRINGE | INTRAVENOUS | Status: DC | PRN
  Administered 2022-02-24: 10 mg via INTRAVENOUS

## 2022-02-24 MED ORDER — 0.9 % SODIUM CHLORIDE (POUR BTL) OPTIME
TOPICAL | Status: DC | PRN
  Administered 2022-02-24: 1000 mL

## 2022-02-24 MED ORDER — ROCURONIUM BROMIDE 10 MG/ML (PF) SYRINGE
PREFILLED_SYRINGE | INTRAVENOUS | Status: AC
  Filled 2022-02-24: qty 10

## 2022-02-24 MED ORDER — LACTATED RINGERS IV SOLN: INTRAVENOUS | Status: DC

## 2022-02-24 MED ORDER — HYDROMORPHONE HCL 1 MG/ML IJ SOLN
INTRAMUSCULAR | Status: AC
  Filled 2022-02-24: qty 1

## 2022-02-24 MED ORDER — GABAPENTIN 300 MG PO CAPS
300.0000 mg | ORAL_CAPSULE | Freq: Once | ORAL | Status: AC
  Administered 2022-02-24: 300 mg via ORAL
  Filled 2022-02-24: qty 1

## 2022-02-24 SURGICAL SUPPLY — 47 items
ADH SKN CLS APL DERMABOND .7 (GAUZE/BANDAGES/DRESSINGS) ×1
BAG COUNTER SPONGE SURGICOUNT (BAG) ×1 IMPLANT
BAG SPNG CNTER NS LX DISP (BAG) ×1
CLSR STERI-STRIP ANTIMIC 1/2X4 (GAUZE/BANDAGES/DRESSINGS) ×1 IMPLANT
COOLER ICEMAN CLASSIC (MISCELLANEOUS) IMPLANT
COVER SURGICAL LIGHT HANDLE (MISCELLANEOUS) ×1 IMPLANT
DERMABOND ADVANCED .7 DNX12 (GAUZE/BANDAGES/DRESSINGS) IMPLANT
DRAPE C-ARM 42X72 X-RAY (DRAPES) ×1 IMPLANT
DRAPE INCISE IOBAN 66X45 STRL (DRAPES) ×2 IMPLANT
DRAPE ORTHO SPLIT 77X108 STRL (DRAPES) ×2
DRAPE SURG ORHT 6 SPLT 77X108 (DRAPES) ×2 IMPLANT
DRAPE U-SHAPE 47X51 STRL (DRAPES) ×2 IMPLANT
DRSG AQUACEL AG ADV 3.5X 6 (GAUZE/BANDAGES/DRESSINGS) IMPLANT
DURAPREP 26ML APPLICATOR (WOUND CARE) ×1 IMPLANT
ELECT REM PT RETURN 9FT ADLT (ELECTROSURGICAL) ×1
ELECTRODE REM PT RTRN 9FT ADLT (ELECTROSURGICAL) ×1 IMPLANT
GLOVE BIOGEL PI IND STRL 8 (GLOVE) ×1 IMPLANT
GLOVE ECLIPSE 8.0 STRL XLNG CF (GLOVE) ×1 IMPLANT
GOWN STRL REUS W/ TWL LRG LVL3 (GOWN DISPOSABLE) ×2 IMPLANT
GOWN STRL REUS W/ TWL XL LVL3 (GOWN DISPOSABLE) ×1 IMPLANT
GOWN STRL REUS W/TWL LRG LVL3 (GOWN DISPOSABLE) ×2
GOWN STRL REUS W/TWL XL LVL3 (GOWN DISPOSABLE) ×1
IMPL KIT LG PECW/BUTTON 3PK (Orthopedic Implant) IMPLANT
IMPLANT KIT LG PECW/BUTTON 3PK (Orthopedic Implant) ×2 IMPLANT
KIT BASIN OR (CUSTOM PROCEDURE TRAY) ×1 IMPLANT
KIT TURNOVER KIT B (KITS) ×1 IMPLANT
MANIFOLD NEPTUNE II (INSTRUMENTS) ×1 IMPLANT
NDL HYPO 25GX1X1/2 BEV (NEEDLE) IMPLANT
NEEDLE HYPO 25GX1X1/2 BEV (NEEDLE) IMPLANT
NS IRRIG 1000ML POUR BTL (IV SOLUTION) ×2 IMPLANT
PACK SHOULDER (CUSTOM PROCEDURE TRAY) ×1 IMPLANT
PAD ARMBOARD 7.5X6 YLW CONV (MISCELLANEOUS) ×2 IMPLANT
PAD COLD SHLDR WRAP-ON (PAD) IMPLANT
SLING ARM IMMOBILIZER LRG (SOFTGOODS) IMPLANT
SLING ARM IMMOBILIZER MED (SOFTGOODS) IMPLANT
SPONGE T-LAP 4X18 ~~LOC~~+RFID (SPONGE) ×1 IMPLANT
STRIP CLOSURE SKIN 1/2X4 (GAUZE/BANDAGES/DRESSINGS) IMPLANT
SUCTION FRAZIER HANDLE 10FR (MISCELLANEOUS)
SUCTION TUBE FRAZIER 10FR DISP (MISCELLANEOUS) IMPLANT
SUT MNCRL AB 4-0 PS2 18 (SUTURE) ×1 IMPLANT
SUT VIC AB 0 CT1 27 (SUTURE) ×1
SUT VIC AB 0 CT1 27XBRD ANBCTR (SUTURE) ×1 IMPLANT
SUT VIC AB 2-0 CT1 27 (SUTURE) ×1
SUT VIC AB 2-0 CT1 TAPERPNT 27 (SUTURE) ×1 IMPLANT
SYR CONTROL 10ML LL (SYRINGE) ×1 IMPLANT
TOWEL GREEN STERILE (TOWEL DISPOSABLE) ×1 IMPLANT
TOWEL GREEN STERILE FF (TOWEL DISPOSABLE) ×1 IMPLANT

## 2022-02-24 NOTE — Discharge Instructions (Signed)
Discharge Instructions    Attending Surgeon: Vanetta Mulders, MD Office Phone Number: [phone]   Diagnosis and Procedures:    Surgeries Performed: Right pectoralis major repair  Discharge Plan:    Diet: Resume usual diet. Begin with light or bland foods.  Drink plenty of fluids.  Activity:  Keep sling and dressing in place until your follow up visit in Physical Therapy You are advised to go home directly from the hospital or surgical center. Restrict your activities.  GENERAL INSTRUCTIONS: 1.  Keep your surgical site elevated above your heart for at least 5-7 days or longer to prevent swelling. This will improve your comfort and your overall recovery following surgery.     2. Please call Dr. Eddie Dibbles office at [phone] with questions Monday-Friday during business hours. If no one answers, please leave a message and someone should get back to the patient within 24 hours. For emergencies please call 911 or proceed to the emergency room.   3. Patient to notify surgical team if experiences any of the following: Bowel/Bladder dysfunction, uncontrolled pain, nerve/muscle weakness, incision with increased drainage or redness, nausea/vomiting and Fever greater than 101.0 F.  Be alert for signs of infection including redness, streaking, odor, fever or chills. Be alert for excessive pain or bleeding and notify your surgeon immediately.  WOUND INSTRUCTIONS:   Leave your dressing/cast/splint in place until your post operative visit.  Keep it clean and dry.  Always keep the incision clean and dry until the staples/sutures are removed. If there is no drainage from the incision you should keep it open to air. If there is drainage from the incision you must keep it covered at all times until the drainage stops  Do not soak in a bath tub, hot tub, pool, lake or other body of water until 21 days after your surgery and your incision is completely dry and healed.  If you have removable  sutures (or staples) they must be removed 10-14 days (unless otherwise instructed) from the day of your surgery.     1)  Elevate the extremity as much as possible.  2)  Keep the dressing clean and dry.  3)  Please call us if the dressing becomes wet or dirty.  4)  If you are experiencing worsening pain or worsening swelling, please call.     MEDICATIONS: Resume all previous home medications at the previous prescribed dose and frequency unless otherwise noted Start taking the  pain medications on an as-needed basis as prescribed  Please taper down pain medication over the next week following surgery.  Ideally you should not require a refill of any narcotic pain medication.  Take pain medication with food to minimize nausea. In addition to the prescribed pain medication, you may take over-the-counter pain relievers such as Tylenol.  Do NOT take additional tylenol if your pain medication already has tylenol in it.  Aspirin 325mg  daily for four weeks.      FOLLOWUP INSTRUCTIONS: 1. Follow up at the Physical Therapy Clinic 3-4 days following surgery. This appointment should be scheduled unless other arrangements have been made.The Physical Therapy scheduling number is [phone] if an appointment has not already been arranged.  2. Contact Dr. Eddie Dibbles office during office hours at [phone] or the practice after hours line at [phone] for non-emergencies. For medical emergencies call 911.   Discharge Location: Home

## 2022-02-24 NOTE — Progress Notes (Signed)
Orthopedic Tech Progress Note Patient Details:  Brandon Lawson 21-Jun-1975 [id number]  Shoulder abduction pillow delivered to OR desk.  Ortho Devices Type of Ortho Device: Shoulder abduction pillow Ortho Device/Splint Location: For RUE Ortho Device/Splint Interventions: Ordered      Carin Primrose 02/24/2022, 2:54 PM

## 2022-02-24 NOTE — Interval H&P Note (Signed)
History and Physical Interval Note:  02/24/2022 12:18 PM  Brandon Lawson  has presented today for surgery, with the diagnosis of RIGHT PECTORALIS TENDON TEAR.  The various methods of treatment have been discussed with the patient and family. After consideration of risks, benefits and other options for treatment, the patient has consented to  Procedure(s): [place] (Right) as a surgical intervention.  The patient's history has been reviewed, patient examined, no change in status, stable for surgery.  I have reviewed the patient's chart and labs.  Questions were answered to the patient's satisfaction.     Vanetta Mulders

## 2022-02-24 NOTE — Brief Op Note (Signed)
   Brief Op Note  Date of Surgery: 02/24/2022  Preoperative Diagnosis: RIGHT PECTORALIS TENDON TEAR  Postoperative Diagnosis: same  Procedure: Procedure(s): RIGHT PECTORALIS MAJOR REPAIR  Implants: Implant Name Type Inv. Item Serial No. Manufacturer Lot No. LRB No. Used Action  IMPLANT KIT LG PECW/BUTTON 3PK - [id number] Orthopedic Implant IMPLANT KIT LG PECW/BUTTON [employer] [id number] Right 2 Implanted    Surgeons: Surgeon(s): Vanetta Mulders, MD  Anesthesia: General    Estimated Blood Loss: See anesthesia record  Complications: None  Condition to PACU: Stable  Yevonne Pax, MD 02/24/2022 2:41 PM

## 2022-02-24 NOTE — Anesthesia Postprocedure Evaluation (Signed)
Anesthesia Post Note  Patient: Brandon Lawson  Procedure(s) Performed: RIGHT PECTORALIS MAJOR REPAIR (Right: Chest)     Patient location during evaluation: PACU Anesthesia Type: General Level of consciousness: awake and alert and oriented Pain management: pain level controlled Vital Signs Assessment: post-procedure vital signs reviewed and stable Respiratory status: spontaneous breathing, nonlabored ventilation and respiratory function stable Cardiovascular status: blood pressure returned to baseline and stable Postop Assessment: no apparent nausea or vomiting Anesthetic complications: no   No notable events documented.  Last Vitals:  Vitals:   02/24/22 1545 02/24/22 1600  BP: 128/81 121/87  Pulse: 60 65  Resp: 11 16  Temp:  36.7 C  SpO2: 97% 96%    Last Pain:  Vitals:   02/24/22 1545  TempSrc:   PainSc: 5                  Elynor Kallenberger A.

## 2022-02-24 NOTE — Anesthesia Procedure Notes (Signed)
Procedure Name: Intubation Date/Time: 02/24/2022 1:20 PM  Performed by: Lind Guest, CRNAPre-anesthesia Checklist: Patient identified, Emergency Drugs available, Suction available, Patient being monitored and Timeout performed Patient Re-evaluated:Patient Re-evaluated prior to induction Oxygen Delivery Method: Circle system utilized Preoxygenation: Pre-oxygenation with 100% oxygen Induction Type: IV induction Ventilation: Mask ventilation without difficulty Grade View: Grade I Tube type: Oral Rae Tube size: 7.5 mm Number of attempts: 1 Placement Confirmation: ETT inserted through vocal cords under direct vision, positive ETCO2 and breath sounds checked- equal and bilateral Secured at: 23 cm Tube secured with: Tape Dental Injury: Teeth and Oropharynx as per pre-operative assessment

## 2022-02-24 NOTE — Op Note (Signed)
   Date of Surgery: 02/24/2022  INDICATIONS: Brandon Lawson is a 47 y.o.-year-old male with right pectoralis major tear.  The risk and benefits of the procedure were discussed in detail and documented in the pre-operative evaluation.   PREOPERATIVE DIAGNOSIS: 1.  Right pectoralis major tear  POSTOPERATIVE DIAGNOSIS: Same.  PROCEDURE: 1.  Right pectoralis major repair  SURGEON: Yevonne Pax MD  ASSISTANT: Brandon Lawson, ATC  ANESTHESIA:  general  IV FLUIDS AND URINE: See anesthesia record.  ANTIBIOTICS: Ancef  ESTIMATED BLOOD LOSS: 15 mL.  IMPLANTS:  Implant Name Type Inv. Item Serial No. Manufacturer Lot No. LRB No. Used Action  IMPLANT KIT LG PECW/BUTTON 3PK - [id number] Orthopedic Implant IMPLANT KIT LG PECW/BUTTON 3PK  [employer] [id number] Right 2 Implanted    DRAINS: None  CULTURES: None  COMPLICATIONS: none  DESCRIPTION OF PROCEDURE:  A modified deltopectoralis approach was made with a bikini strap skin incision.  The cephalic vein was identified and the interval was established.  Two blunt homans was placed laterally to expose a portion of the humerus.  The clavicular head of the pectoralis muscle was exposed.  A medial skin flap was developed over the clavicular head to identify the sternal head.  The torn sternal head of the pectoralis major muscle was identified. There was an avulsion type injury with approximately 2cm of tendon attached to the muscle belly.  This was retracted approximately 2 cm.  It was easily mobilized.  21mm Fiber tapes were placed into the tendon and muscle belly in a locking Krackow stitch fashion. This was done 3 times for a totat of 6 limbs   The clavicular head of the pectoralis major was retracted laterally to expose the insertion. The insertion of the tendon was identified just lateral to the biceps tendon and proximal to the clavicular head insertion. This area was debrided with  to stimulate bony bleeding to facilitate healing.    Next, the  suture tapes were loaded into [employer] pectoralis button. 3 unicortical drill holes were made at the insertion site and appropriately spaced apart. The buttons were placed in the standard fashion and flipped from inferior to superior. The buttons were confirmed to be flipped. Next, the sutures were tensioned to bring the tendon back to the footprint. The sutures were then tied sequentially with the aid of an arthroscopic knot pusher. After tensioning, the arm was taken through range of motion to insure proper tensioning and that biceps tendon was moving freely.   The wound was thoroughly irrigated. The incision was closed in layers with 0-vicryl, 2-0 vicryl, and 3-0 monocryl. The dressings included xeroform, 4x4s, and tegaderms. The arm placed in a shoulder immobilizer.   The patient awoke from anesthesia without difficulty and was transferred to the PACU in stable condition.      POSTOPERATIVE PLAN: He will be nonweightbearing until he begins physical therapy at which time he will begin the pectoralis major repair protocol.  He will be placed in a sling until he begins physical therapy.  He will not have an abduction pillow.  He will be placed on aspirin for DVT prophylaxis  Yevonne Pax, MD 2:41 PM

## 2022-02-24 NOTE — Telephone Encounter (Signed)
Order for PT emailed to [place] case manager, Argentina Donovan w/ [facility] for approval.

## 2022-02-24 NOTE — Anesthesia Preprocedure Evaluation (Addendum)
Anesthesia Evaluation  Patient identified by MRN, date of birth, ID band Patient awake    Reviewed: Allergy & Precautions, NPO status , Patient's Chart, lab work & pertinent test results  Airway Mallampati: II       Dental no notable dental hx. (+) Teeth Intact, Dental Advisory Given   Pulmonary asthma , former smoker   Pulmonary exam normal breath sounds clear to auscultation       Cardiovascular negative cardio ROS Normal cardiovascular exam Rhythm:Regular Rate:Normal     Neuro/Psych  negative psych ROS   GI/Hepatic negative GI ROS, Neg liver ROS,,,  Endo/Other  Hyperlipidemia- diet controlled  Renal/GU negative Renal ROS  negative genitourinary   Musculoskeletal Right pectoralis major tear   Abdominal   Peds  Hematology negative hematology ROS (+)   Anesthesia Other Findings   Reproductive/Obstetrics                             Anesthesia Physical Anesthesia Plan  ASA: 2  Anesthesia Plan: General   Post-op Pain Management: Dilaudid IV, Ofirmev IV (intra-op)* and Precedex   Induction: Intravenous  PONV Risk Score and Plan: 3 and Treatment may vary due to age or medical condition, Midazolam, Ondansetron and Dexamethasone  Airway Management Planned: Oral ETT  Additional Equipment: None  Intra-op Plan:   Post-operative Plan: Extubation in OR  Informed Consent: I have reviewed the patients History and Physical, chart, labs and discussed the procedure including the risks, benefits and alternatives for the proposed anesthesia with the patient or authorized representative who has indicated his/her understanding and acceptance.     Dental advisory given  Plan Discussed with: CRNA and Anesthesiologist  Anesthesia Plan Comments:        Anesthesia Quick Evaluation

## 2022-02-24 NOTE — Transfer of Care (Signed)
Immediate Anesthesia Transfer of Care Note  Patient: Brandon Lawson  Procedure(s) Performed: RIGHT PECTORALIS MAJOR REPAIR (Right: Chest)  Patient Location: PACU  Anesthesia Type:General  Level of Consciousness: drowsy and patient cooperative  Airway & Oxygen Therapy: Patient Spontanous Breathing and Patient connected to face mask oxygen  Post-op Assessment: Report given to RN and Post -op Vital signs reviewed and stable  Post vital signs: Reviewed and stable  Last Vitals:  Vitals Value Taken Time  BP 125/74 02/24/22 1454  Temp    Pulse 64 02/24/22 1456  Resp 18 02/24/22 1456  SpO2 98 % 02/24/22 1456  Vitals shown include unvalidated device data.  Last Pain:  Vitals:   02/24/22 1053  TempSrc:   PainSc: 2       Patients Stated Pain Goal: 0 (34/35/68 6168)  Complications: No notable events documented.

## 2022-02-25 ENCOUNTER — Encounter (HOSPITAL_COMMUNITY): Payer: Self-pay | Admitting: Orthopaedic Surgery

## 2022-02-28 ENCOUNTER — Encounter (HOSPITAL_BASED_OUTPATIENT_CLINIC_OR_DEPARTMENT_OTHER): Payer: Self-pay | Admitting: Physical Therapy

## 2022-02-28 ENCOUNTER — Other Ambulatory Visit: Payer: Self-pay

## 2022-02-28 ENCOUNTER — Ambulatory Visit (HOSPITAL_BASED_OUTPATIENT_CLINIC_OR_DEPARTMENT_OTHER): Payer: No Typology Code available for payment source | Attending: Orthopaedic Surgery | Admitting: Physical Therapy

## 2022-02-28 DIAGNOSIS — M25511 Pain in right shoulder: Secondary | ICD-10-CM | POA: Insufficient documentation

## 2022-02-28 DIAGNOSIS — M25611 Stiffness of right shoulder, not elsewhere classified: Secondary | ICD-10-CM | POA: Insufficient documentation

## 2022-02-28 DIAGNOSIS — M6281 Muscle weakness (generalized): Secondary | ICD-10-CM | POA: Diagnosis present

## 2022-02-28 DIAGNOSIS — S29011A Strain of muscle and tendon of front wall of thorax, initial encounter: Secondary | ICD-10-CM | POA: Diagnosis not present

## 2022-02-28 NOTE — Therapy (Signed)
OUTPATIENT PHYSICAL THERAPY UPPER EXTREMITY EVALUATION   Patient Name: Brandon Lawson MRN: [id number] DOB:[birth year], 47 y.o., male Today's Date: 02/28/2022  END OF SESSION:  PT End of Session - 02/28/22 0937     Visit Number 1    Number of Visits 25    Date for PT Re-Evaluation 05/29/22    Authorization Type Worker's Comp    PT Start Time 0930    PT Stop Time 1010    PT Time Calculation (min) 40 min    Equipment Utilized During Treatment --   Sling with ABD pillow   Activity Tolerance Patient tolerated treatment well    Behavior During Therapy WFL for tasks assessed/performed             Past Medical History:  Diagnosis Date   Asthma    seasonal only- no inhaler   HLD (hyperlipidemia)    no meds, diet controlled   Hyperlipidemia    Past Surgical History:  Procedure Laterality Date   COLONOSCOPY  2023   PECTORALIS TENDON REPAIR Right 02/24/2022   Procedure: RIGHT PECTORALIS MAJOR REPAIR;  Surgeon: Vanetta Mulders, MD;  Location: [place];  Service: Orthopedics;  Laterality: Right;   Testicular torsion     at age 89 yrs old   Patient Active Problem List   Diagnosis Date Noted   Pectoralis muscle rupture 02/24/2022    PCP:  Wenda Low, MD       REFERRING PROVIDER:  Vanetta Mulders, MD     REFERRING DIAG:  S29.011A (ICD-10-CM) - Rupture of pectoralis major muscle, initial encounter      THERAPY DIAG:  Acute pain of right shoulder  Muscle weakness (generalized)  Decreased right shoulder range of motion  Rationale for Evaluation and Treatment: Rehabilitation  ONSET DATE: 02/24/22 Surgery  Days since surgery: 4   PROCEDURE: 1.  Right pectoralis major repair    SUBJECTIVE:                                                                                                                                                                                      SUBJECTIVE STATEMENT: Pt states that pain has been fairly well managed. He has only been  taking the aspirin as prescribed. It will ache on occasion but no pain. Moving around will cause it to ache. Sleeps in bed with pillows on an incline. Icing 2x a day. Keeping it on for 30-40 min at a time. Pt denies signs of infection or DVT. Requires heavy lifting, door breaching, climbing ladders, etc. At work.   PERTINENT HISTORY: N/A  PAIN:  Are you having pain? No: NPRS scale: 0/10   PRECAUTIONS: Shoulder  WEIGHT BEARING RESTRICTIONS: Yes NWB "He will not have an abduction pillow"  FALLS:  Has patient fallen in last 6 months? 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 Complete tear of the distal tendon of the right pectoralis major muscle with medial retraction of approximately 8 cm. 2. Evolving hematoma surrounding the torn tendon stump measuring approximately 5.7 x 4.6 x 4.6 cm.  PATIENT SURVEYS :  FOTO 34 71 @ DC 23 pts MCII  COGNITION: Overall cognitive status: Within functional limits for tasks assessed                                     SENSATION: WFL   POSTURE: Rounded shoulders   UPPER EXTREMITY ROM:    Passive ROM Right eval Left eval  Shoulder flexion 65 WNL  Shoulder extension N/A WNL  Shoulder abduction 60 WNL  Shoulder adduction      Shoulder internal rotation To belly WNL  Shoulder external rotation N/A WNL  (Blank rows = not tested)   UPPER EXTREMITY MMT: Not indicated at eval   PALPATION:  No TTP noted around incision sights; aquacell bandage in place without signs of erythema, drainage, or excessive edema               TODAY'S TREATMENT:                                                                                                                                          DATE: 02/28/22   Exercises - Seated Scapular Retraction  - 2-3 x daily - 7 x weekly - 2 sets - 10 reps - Circular Shoulder Pendulum with Table Support  - 2-3 x daily - 7 x weekly - 1 sets - 20 reps - Flexion-Extension Shoulder Pendulum with Table Support  - 2-3 x daily - 7 x weekly - 1 sets - 20 reps - Horizontal Shoulder Pendulum with Table Support  - 2-3 x daily - 7 x weekly - 1 sets - 20 reps - Seated Wrist Extension with Dumbbell  - 2-3 x daily - 7 x weekly - 3 sets - 10 reps - Seated Wrist Flexion with Dumbbell  - 2-3 x daily - 7 x weekly - 3 sets - 10 reps - Forearm Pronation and Supination with Hammer  - 2-3 x daily - 7 x weekly - 3 sets - 10 reps   PATIENT EDUCATION: Education details: MOI, diagnosis, prognosis, anatomy, exercise progression, DOMS expectations, muscle firing,  envelope of function, HEP, POC   Person  educated: Patient Education method: Explanation, Demonstration, Tactile cues, Verbal cues, and Handouts Education comprehension: verbalized understanding, returned demonstration, verbal cues required, and tactile cues required   HOME EXERCISE PROGRAM:   Access Code: [email or web address] URL: [email or web address] Date: 02/28/2022 Prepared by: Daleen Bo      ASSESSMENT:   CLINICAL IMPRESSION: Patient is a 47 y.o. male who was seen today for physical therapy evaluation and treatment for s/p R pec major repair.  Patient with well-managed pain at today's session.  Pt is to remain in sling without ABD pillow at the time.  Patient with expected range of motion and strength limitations given postoperative status.  Plan to follow pec major repair protocol.  No external rotation or flexion beyond 90 today per precautions.  Pt would benefit from continued skilled therapy in order to reach goals and maximize functional R UE strength and ROM for full return to PLOF. Marland Kitchen    OBJECTIVE IMPAIRMENTS decreased strength, impaired UE  functional use, improper body mechanics, postural dysfunction, pain, and hypermobility .    ACTIVITY LIMITATIONS community activity, occupation, dressing, bathing, self-care, and exercise/recreation .    PERSONAL FACTORS Fitness, 1-2 comorbidities, and Time since onset of injury/illness/exacerbation are also affecting patient's functional outcome.      REHAB POTENTIAL: Good   CLINICAL DECISION MAKING: Stable/uncomplicated   EVALUATION COMPLEXITY: Low   GOALS:     SHORT TERM GOALS: Target Date . 04/11/2022          Pt will become independent with HEP in order to demonstrate synthesis of PT education.     Goal status: INITIAL   2.  Pt will be able to demonstrate PROM of 90 flexion in order to demonstrate functional improvement in UE function for self-care and house hold duties.     Goal status: INITIAL   3.  Pt will be able to demonstrate ability to perform full AROM without discomfort or limitation in order to demonstrate functional improvement in UE function for self-care and house hold duties.    Goal status: INITIAL     LONG TERM GOALS: Target Date 05/23/2022        Pt  will become independent with final HEP in order to demonstrate synthesis of PT education.     Goal status: INITIAL   2.  Pt will be able to demonstrate ability to perform 5lb shoulder flexion, ABD, and scaption in order to demonstrate functional improvement in UE function for self-care and house hold duties.      Goal status: INITIAL   3.  Pt will be able to demonstrate 5x full push ups without pain in order to demonstrate functional improvement in UE function for progression towards return to exercise and ability to push during normal daily activity.      Goal status: INITIAL   4.  Pt will be able to reach, lift, and carry OH >25lbs  in order to demonstrate functional improvement in R UE strength for transition to return to occupation.     Goal status: INITIAL     PLAN: PT FREQUENCY:  1-2x/week   PT DURATION: 12 weeks   PLANNED INTERVENTIONS: Therapeutic exercises, Therapeutic activity, Neuromuscular re-education, Balance training, Gait training, Patient/Family education, Self Care, Joint mobilization, Joint manipulation, DME instructions, Aquatic Therapy, Dry Needling, Electrical stimulation, Spinal manipulation, Spinal mobilization, Cryotherapy, Moist heat, scar mobilization, Splintting, Taping, Vasopneumatic device, Traction, Ultrasound, Ionotophoresis 4mg /ml Dexamethasone, Manual therapy, and Re-evaluation   PLAN FOR NEXT SESSION: continue per [facility] repair protocol  Zebedee Iba, PT 02/28/2022, 1:00 PM

## 2022-03-07 ENCOUNTER — Ambulatory Visit (HOSPITAL_BASED_OUTPATIENT_CLINIC_OR_DEPARTMENT_OTHER): Payer: No Typology Code available for payment source | Attending: Orthopaedic Surgery | Admitting: Physical Therapy

## 2022-03-07 ENCOUNTER — Encounter (HOSPITAL_BASED_OUTPATIENT_CLINIC_OR_DEPARTMENT_OTHER): Payer: Self-pay | Admitting: Physical Therapy

## 2022-03-07 DIAGNOSIS — M25511 Pain in right shoulder: Secondary | ICD-10-CM | POA: Diagnosis present

## 2022-03-07 DIAGNOSIS — M6281 Muscle weakness (generalized): Secondary | ICD-10-CM | POA: Insufficient documentation

## 2022-03-07 DIAGNOSIS — M25611 Stiffness of right shoulder, not elsewhere classified: Secondary | ICD-10-CM | POA: Diagnosis present

## 2022-03-07 NOTE — Therapy (Signed)
OUTPATIENT PHYSICAL THERAPY UPPER EXTREMITY EVALUATION   Patient Name: Brandon Lawson MRN: [id number] DOB:[birth year], 47 y.o., male Today's Date: 03/07/2022  END OF SESSION:  PT End of Session - 03/07/22 1007     Visit Number 2    Number of Visits 25    Date for PT Re-Evaluation 05/29/22    Authorization Type Worker's Comp    PT Start Time 0932    PT Stop Time 0959    PT Time Calculation (min) 27 min    Equipment Utilized During Treatment --   Sling with ABD pillow   Activity Tolerance Patient tolerated treatment well    Behavior During Therapy WFL for tasks assessed/performed              Past Medical History:  Diagnosis Date   Asthma    seasonal only- no inhaler   HLD (hyperlipidemia)    no meds, diet controlled   Hyperlipidemia    Past Surgical History:  Procedure Laterality Date   COLONOSCOPY  2023   PECTORALIS TENDON REPAIR Right 02/24/2022   Procedure: RIGHT PECTORALIS MAJOR REPAIR;  Surgeon: Vanetta Mulders, MD;  Location: [place];  Service: Orthopedics;  Laterality: Right;   Testicular torsion     at age 23 yrs old   Patient Active Problem List   Diagnosis Date Noted   Pectoralis muscle rupture 02/24/2022    PCP:  Wenda Low, MD       REFERRING PROVIDER:  Vanetta Mulders, MD     REFERRING DIAG:  S29.011A (ICD-10-CM) - Rupture of pectoralis major muscle, initial encounter      THERAPY DIAG:  Acute pain of right shoulder  Decreased right shoulder range of motion  Muscle weakness (generalized)  Rationale for Evaluation and Treatment: Rehabilitation  ONSET DATE: 02/24/22 Surgery  Days since surgery: 11   PROCEDURE: 1.  Right pectoralis major repair    SUBJECTIVE:                                                                                                                                                                                      SUBJECTIVE STATEMENT:  Pt states he is doing well but has a lateral/anterior shoulder  pain that feels like a dull ache after a while. Notices with being up and about more. Has been able to work out  the LE without pain into R UE. Pt still with bandage in place and continues to use the sling. .   Eval: Pt states that pain has been fairly well managed. He has only been taking the aspirin as prescribed. It will ache on occasion but no pain. Moving around will cause it  to ache. Sleeps in bed with pillows on an incline. Icing 2x a day. Keeping it on for 30-40 min at a time. Pt denies signs of infection or DVT. Requires heavy lifting, door breaching, climbing ladders, etc. At work.   PERTINENT HISTORY: N/A  PAIN:  Are you having pain? No: NPRS scale: 0/10   PRECAUTIONS: Shoulder  WEIGHT BEARING RESTRICTIONS: Yes NWB "He will not have an abduction pillow"  FALLS:  Has patient fallen in last 6 months? 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 Complete tear of the distal tendon of the right pectoralis major muscle with medial retraction of approximately 8 cm. 2. Evolving hematoma surrounding the torn tendon stump measuring approximately 5.7 x 4.6 x 4.6 cm.  PATIENT SURVEYS :  FOTO 34 71 @ DC 23 pts MCII UPPER EXTREMITY ROM:    Passive ROM Right eval R 2/5  Shoulder flexion 65 87  Shoulder extension N/A   Shoulder abduction 60 60  Shoulder adduction     Shoulder internal rotation To belly   Shoulder external rotation N/A To neutral  (Blank rows = not tested)    PALPATION:  No TTP noted around incision sights; aquacell bandage in place without signs of erythema, drainage, or excessive edema               TODAY'S TREATMENT:                                                                                                                                          DATE:  2/5  PROM to protocol limits in flexion and ABD   LAD with oscillation at 45 deg of ABD STM to lateral shoulder/deltoid  02/28/22   Exercises - Seated Scapular Retraction  - 2-3 x daily - 7 x weekly - 2 sets - 10 reps - Circular Shoulder Pendulum with Table Support  - 2-3 x daily - 7 x weekly - 1 sets - 20 reps - Flexion-Extension Shoulder Pendulum with Table Support  - 2-3 x daily - 7 x weekly - 1 sets - 20 reps - Horizontal Shoulder Pendulum with Table Support  - 2-3 x daily - 7 x weekly - 1 sets - 20 reps - Seated Wrist Extension with Dumbbell  - 2-3 x daily - 7 x weekly - 3 sets - 10 reps - Seated Wrist Flexion with Dumbbell  - 2-3 x daily - 7 x weekly - 3 sets - 10 reps - Forearm Pronation and Supination with Hammer  - 2-3 x daily - 7 x weekly - 3 sets - 10 reps  PATIENT EDUCATION: Education details: anatomy, exercise progression, DOMS expectations, muscle firing,  envelope of function, HEP, POC   Person educated: Patient Education method: Explanation, Demonstration, Tactile cues, Verbal cues, and Handouts Education comprehension: verbalized understanding, returned demonstration, verbal cues required, and tactile cues required   HOME EXERCISE PROGRAM:   Access Code: [email or web address] URL: [email or web address] Date: 02/28/2022 Prepared by: Daleen Bo      ASSESSMENT:   CLINICAL IMPRESSION: Pt continues to have well managed pain about the surgical site but does have what appears to be deltoid hypertonicity/trigger point that are likely due to spasm/guarding. Pt had improved soft tissue extensibility and improved pain following STM. Pt was able to reach near PROM limits of protocol at this time with flexion. Will slowly progress ABD at future visits. Pt to have MD visit this week for wound check and surgical follow up visit. Pt would benefit from continued  skilled therapy in order to reach goals and maximize functional R UE strength and ROM for full return to PLOF. Marland Kitchen    OBJECTIVE IMPAIRMENTS decreased strength, impaired UE functional use, improper body mechanics, postural dysfunction, pain, and hypermobility .    ACTIVITY LIMITATIONS community activity, occupation, dressing, bathing, self-care, and exercise/recreation .    PERSONAL FACTORS Fitness, 1-2 comorbidities, and Time since onset of injury/illness/exacerbation are also affecting patient's functional outcome.      REHAB POTENTIAL: Good   CLINICAL DECISION MAKING: Stable/uncomplicated   EVALUATION COMPLEXITY: Low   GOALS:     SHORT TERM GOALS: Target Date . 04/11/2022          Pt will become independent with HEP in order to demonstrate synthesis of PT education.     Goal status: INITIAL   2.  Pt will be able to demonstrate PROM of 90 flexion in order to demonstrate functional improvement in UE function for self-care and house hold duties.     Goal status: INITIAL   3.  Pt will be able to demonstrate ability to perform full AROM without discomfort or limitation in order to demonstrate functional improvement in UE function for self-care and house hold duties.    Goal status: INITIAL     LONG TERM GOALS: Target Date 05/23/2022        Pt  will become independent with final HEP in order to demonstrate synthesis of PT education.     Goal status: INITIAL   2.  Pt will be able to demonstrate ability to perform 5lb shoulder flexion, ABD, and scaption in order to demonstrate functional improvement in UE function for self-care and house hold duties.      Goal status: INITIAL   3.  Pt will be able to demonstrate 5x full push ups without pain in order to demonstrate functional improvement in UE function for progression towards return to exercise and ability to push during normal daily activity.      Goal status: INITIAL   4.  Pt will be able to reach, lift, and carry  OH >25lbs  in order to demonstrate functional improvement in R UE strength for transition to return to occupation.     Goal status: INITIAL     PLAN: PT FREQUENCY: 1-2x/week   PT DURATION: 12 weeks   PLANNED INTERVENTIONS: Therapeutic exercises, Therapeutic activity, Neuromuscular re-education, Balance training, Gait training, Patient/Family education, Self Care, Joint mobilization, Joint manipulation, DME instructions, Aquatic Therapy, Dry Needling, Electrical stimulation, Spinal manipulation, Spinal mobilization, Cryotherapy, Moist heat, scar mobilization, Splintting, Taping, Vasopneumatic device, Traction, Ultrasound, Ionotophoresis 4mg /ml Dexamethasone, Manual  therapy, and Re-evaluation   PLAN FOR NEXT SESSION: continue per [facility] repair protocol     Zebedee Iba, PT 03/07/2022, 10:13 AM

## 2022-03-09 ENCOUNTER — Ambulatory Visit (HOSPITAL_BASED_OUTPATIENT_CLINIC_OR_DEPARTMENT_OTHER): Payer: No Typology Code available for payment source | Admitting: Orthopaedic Surgery

## 2022-03-09 DIAGNOSIS — S29011A Strain of muscle and tendon of front wall of thorax, initial encounter: Secondary | ICD-10-CM

## 2022-03-09 NOTE — Progress Notes (Signed)
Post Operative Evaluation    Procedure/Date of Surgery: Right pectoralis major repair 02/24/22  Interval History:    Presents today 2 weeks status post right pectoralis major tendon repair.  Overall he is doing very well.  He has been working with physical therapy.  His pain has been well-controlled.  He is experiencing some stiffness and soreness with overhead motion.   PMH/PSH/Family History/Social History/Meds/Allergies:    Past Medical History:  Diagnosis Date   Asthma    seasonal only- no inhaler   HLD (hyperlipidemia)    no meds, diet controlled   Hyperlipidemia    Past Surgical History:  Procedure Laterality Date   COLONOSCOPY  2023   PECTORALIS TENDON REPAIR Right 02/24/2022   Procedure: RIGHT PECTORALIS MAJOR REPAIR;  Surgeon: Vanetta Mulders, MD;  Location: [place];  Service: Orthopedics;  Laterality: Right;   Testicular torsion     at age 11 yrs old   Social History   Socioeconomic History   Marital status: Married    Spouse name: Not on file   Number of children: 3   Years of education: Not on file   Highest education level: Bachelor's degree (e.g., BA, AB, BS)  Occupational History   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[employer]    Employer: [employer]  Tobacco Use   Smoking status: Former   Smokeless tobacco: Current    Types: Chew   Tobacco comments:    every now and then  while in college only  Vaping Use   Vaping Use: Never used  Substance and Sexual Activity   Alcohol use: Yes    Alcohol/week: 3.0 - 5.0 standard drinks of alcohol    Types: 3 - 5 Glasses of wine per week    Comment: Socially    Drug use: No   Sexual activity: Yes  Other Topics Concern   Not on file  Social History Narrative   Not on file   Social Determinants of Health   Financial Resource Strain: Not on file  Food Insecurity: Not on file  Transportation Needs: Not on file  Physical Activity: Sufficiently Active (04/14/2017)   Exercise Vital Sign     Days of Exercise per Week: 5 days    Minutes of Exercise per Session: 30 min  Stress: Not on file  Social Connections: Not on file   Family History  Problem Relation Age of Onset   Hypertension Mother    Hyperlipidemia Mother    Heart attack Father    Heart disease Father    Coronary artery disease Father    Heart attack Maternal Grandmother    No Known Allergies Current Outpatient Medications  Medication Sig Dispense Refill   aspirin EC 325 MG tablet Take 1 tablet (325 mg total) by mouth daily. (Patient taking differently: Take 325 mg by mouth daily. For after surgery) 30 tablet 0   fluticasone (FLONASE) 50 MCG/ACT nasal spray Place 1 spray into both nostrils daily.     naphazoline-glycerin (CLEAR EYES REDNESS) 0.012-0.2 % SOLN Place 1-2 drops into both eyes 4 (four) times daily as needed for eye irritation.     naproxen (NAPROSYN) 500 MG tablet Take 1 tablet (500 mg total) by mouth 2 (two) times daily. (Patient not taking: Reported on 02/21/2022) 30 tablet 0   Omega-3 1000 MG CAPS Take 1,000 mg by  mouth daily.     oxyCODONE (OXY IR/ROXICODONE) 5 MG immediate release tablet Take 1 tablet (5 mg total) by mouth every 4 (four) hours as needed (severe pain). 20 tablet 0   No current facility-administered medications for this visit.   No results found.  Review of Systems:   A ROS was performed including pertinent positives and negatives as documented in the HPI.   Musculoskeletal Exam:    There were no vitals taken for this visit.  Right shoulder incision is well-appearing without erythema or drainage.  No palpable defect in the axillary fold.  Sensation is intact in all distribution of the right hand.  Is able to flex and extend at the right elbow and right wrist.  Fires EPL.  2+ radial pulse  Imaging:      I personally reviewed and interpreted the radiographs.   Assessment:   2 weeks status post right pectoralis major tendon repair overall doing very well.  At this time we  will continue to advance Kar[facility] pectoralis major repair protocol.  I will plan to see him back in 4 weeks for reassessment.  Plan :    -Return to clinic 4 weeks for reassessment      I personally saw and evaluated the patient, and participated in the management and treatment plan.  Vanetta Mulders, MD Attending Physician, Orthopedic Surgery  This document was dictated using Dragon voice recognition software. A reasonable attempt at proof reading has been made to minimize errors.

## 2022-03-14 ENCOUNTER — Ambulatory Visit (HOSPITAL_BASED_OUTPATIENT_CLINIC_OR_DEPARTMENT_OTHER): Payer: No Typology Code available for payment source | Attending: Orthopaedic Surgery | Admitting: Physical Therapy

## 2022-03-14 ENCOUNTER — Encounter (HOSPITAL_BASED_OUTPATIENT_CLINIC_OR_DEPARTMENT_OTHER): Payer: Self-pay | Admitting: Physical Therapy

## 2022-03-14 DIAGNOSIS — M6281 Muscle weakness (generalized): Secondary | ICD-10-CM | POA: Insufficient documentation

## 2022-03-14 DIAGNOSIS — M25611 Stiffness of right shoulder, not elsewhere classified: Secondary | ICD-10-CM | POA: Diagnosis present

## 2022-03-14 DIAGNOSIS — M25511 Pain in right shoulder: Secondary | ICD-10-CM | POA: Insufficient documentation

## 2022-03-14 NOTE — Therapy (Signed)
OUTPATIENT PHYSICAL THERAPY UPPER EXTREMITY EVALUATION   Patient Name: Brandon Lawson MRN: [id number] DOB:09-06-1975, 47 y.o., male Today's Date: 03/14/2022  END OF SESSION:  PT End of Session - 03/14/22 1013     Visit Number 3    Number of Visits 25    Date for PT Re-Evaluation 05/29/22    Authorization Type Worker's Comp    PT Start Time [phone]    PT Stop Time 1012    PT Time Calculation (min) 39 min    Equipment Utilized During Treatment --   Sling with ABD pillow   Activity Tolerance Patient tolerated treatment well    Behavior During Therapy WFL for tasks assessed/performed               Past Medical History:  Diagnosis Date   Asthma    seasonal only- no inhaler   HLD (hyperlipidemia)    no meds, diet controlled   Hyperlipidemia    Past Surgical History:  Procedure Laterality Date   COLONOSCOPY  2023   PECTORALIS TENDON REPAIR Right 02/24/2022   Procedure: RIGHT PECTORALIS MAJOR REPAIR;  Surgeon: Vanetta Mulders, MD;  Location: [facility];  Service: Orthopedics;  Laterality: Right;   Testicular torsion     at age 44 yrs old   Patient Active Problem List   Diagnosis Date Noted   Pectoralis muscle rupture 02/24/2022    PCP:  Wenda Low, MD       REFERRING PROVIDER:  Vanetta Mulders, MD     REFERRING DIAG:  S29.011A (ICD-10-CM) - Rupture of pectoralis major muscle, initial encounter      THERAPY DIAG:  Acute pain of right shoulder  Decreased right shoulder range of motion  Muscle weakness (generalized)  Rationale for Evaluation and Treatment: Rehabilitation  ONSET DATE: 02/24/22 Surgery  Days since surgery: 18   PROCEDURE: 1.  Right pectoralis major repair    SUBJECTIVE:                                                                                                                                                                                      SUBJECTIVE STATEMENT:  Pt states the shoulder is progressively improving and the pain  the in the lateral shoulder.   Eval: Pt states that pain has been fairly well managed. He has only been taking the aspirin as prescribed. It will ache on occasion but no pain. Moving around will cause it to ache. Sleeps in bed with pillows on an incline. Icing 2x a day. Keeping it on for 30-40 min at a time. Pt denies signs of infection or DVT. Requires heavy lifting, door breaching, climbing ladders, etc. At  work.   PERTINENT HISTORY: N/A  PAIN:  Are you having pain? No: NPRS scale: 0/10   PRECAUTIONS: Shoulder  WEIGHT BEARING RESTRICTIONS: Yes NWB "He will not have an abduction pillow"  FALLS:  Has patient fallen in last 6 months? 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 Complete tear of the distal tendon of the right pectoralis major muscle with medial retraction of approximately 8 cm. 2. Evolving hematoma surrounding the torn tendon stump measuring approximately 5.7 x 4.6 x 4.6 cm.  PATIENT SURVEYS :  FOTO 34 71 @ DC 23 pts MCII UPPER EXTREMITY ROM:    Passive ROM Right eval R 2/5  Shoulder flexion 65 87  Shoulder extension N/A   Shoulder abduction 60 60  Shoulder adduction     Shoulder internal rotation To belly   Shoulder external rotation N/A To neutral  (Blank rows = not tested)    PALPATION:  No TTP noted around incision sights; aquacell bandage in place without signs of erythema, drainage, or excessive edema               TODAY'S TREATMENT:                                                                                                                                         DATE:   2/5  PROM to tolerance flexion and ABD  R GHJ inf and post  grade III  Pulley's (full PASSIVE relaxation of R) 2 min flexion Table slide flexion and ABD 5s 10x (scooting away with hand passively resting on table)  Isometrics- flexion, ABD, ext, ER 5s 10x each 2lb curls and triceps kickbacks   2/5  PROM to protocol limits in flexion and ABD   LAD with oscillation at 45 deg of ABD STM to lateral shoulder/deltoid  02/28/22   Exercises - Seated Scapular Retraction  - 2-3 x daily - 7 x weekly - 2 sets - 10 reps - Circular Shoulder Pendulum with Table Support  - 2-3 x daily - 7 x weekly - 1 sets - 20 reps - Flexion-Extension Shoulder Pendulum with Table Support  - 2-3 x daily - 7 x weekly - 1 sets - 20 reps - Horizontal Shoulder Pendulum with Table Support  - 2-3 x daily - 7 x weekly - 1 sets - 20 reps - Seated Wrist Extension with Dumbbell  - 2-3 x daily - 7 x weekly - 3 sets - 10 reps - Seated Wrist Flexion with Dumbbell  - 2-3 x daily - 7 x weekly - 3 sets - 10 reps - Forearm Pronation and Supination  with Hammer  - 2-3 x daily - 7 x weekly - 3 sets - 10 reps   PATIENT EDUCATION: Education details: anatomy, exercise progression, DOMS expectations, muscle firing,  envelope of function, HEP, POC   Person educated: Patient Education method: Explanation, Demonstration, Tactile cues, Verbal cues, and Handouts Education comprehension: verbalized understanding, returned demonstration, verbal cues required, and tactile cues required   HOME EXERCISE PROGRAM:   Access Code: [email or web address] URL: [email or web address] Date: 02/28/2022 Prepared by: Daleen Bo      ASSESSMENT:   CLINICAL IMPRESSION: Pt presents to session without sling but understands he will need to don it prior to returning to the gym today. Pt has steri-strips in place without signs of infection. Pt able to progress to next phase of protocol with PROM past 90 deg with 160 flexion and ABD PROM being the goal by 6 wks. HEP updated today. Pt without pain throughout session  and all precautions discussed. Pt progressing well at this time. Pt would benefit from continued skilled therapy in order to reach goals and maximize functional R UE strength and ROM for full return to PLOF. [place] Kitchen    OBJECTIVE IMPAIRMENTS decreased strength, impaired UE functional use, improper body mechanics, postural dysfunction, pain, and hypermobility .    ACTIVITY LIMITATIONS community activity, occupation, dressing, bathing, self-care, and exercise/recreation .    PERSONAL FACTORS Fitness, 1-2 comorbidities, and Time since onset of injury/illness/exacerbation are also affecting patient's functional outcome.      REHAB POTENTIAL: Good   CLINICAL DECISION MAKING: Stable/uncomplicated   EVALUATION COMPLEXITY: Low   GOALS:     SHORT TERM GOALS: Target Date . 04/11/2022          Pt will become independent with HEP in order to demonstrate synthesis of PT education.     Goal status: INITIAL   2.  Pt will be able to demonstrate PROM of 90 flexion in order to demonstrate functional improvement in UE function for self-care and house hold duties.     Goal status: INITIAL   3.  Pt will be able to demonstrate ability to perform full AROM without discomfort or limitation in order to demonstrate functional improvement in UE function for self-care and house hold duties.    Goal status: INITIAL     LONG TERM GOALS: Target Date 05/23/2022        Pt  will become independent with final HEP in order to demonstrate synthesis of PT education.     Goal status: INITIAL   2.  Pt will be able to demonstrate ability to perform 5lb shoulder flexion, ABD, and scaption in order to demonstrate functional improvement in UE function for self-care and house hold duties.      Goal status: INITIAL   3.  Pt will be able to demonstrate 5x full push ups without pain in order to demonstrate functional improvement in UE function for progression towards return to exercise and ability to push during normal  daily activity.      Goal status: INITIAL   4.  Pt will be able to reach, lift, and carry OH >25lbs  in order to demonstrate functional improvement in R UE strength for transition to return to occupation.     Goal status: INITIAL     PLAN: PT FREQUENCY: 1-2x/week   PT DURATION: 12 weeks   PLANNED INTERVENTIONS: Therapeutic exercises, Therapeutic activity, Neuromuscular re-education, Balance training, Gait training, Patient/Family education, Self Care, Joint mobilization, Joint manipulation, DME instructions, Aquatic Therapy, Dry Needling, Electrical stimulation, Spinal  manipulation, Spinal mobilization, Cryotherapy, Moist heat, scar mobilization, Splintting, Taping, Vasopneumatic device, Traction, Ultrasound, Ionotophoresis 75m/ml Dexamethasone, Manual therapy, and Re-evaluation   PLAN FOR NEXT SESSION: [facility]     ADaleen Bo PT 03/14/2022, 10:15 AM

## 2022-03-21 ENCOUNTER — Other Ambulatory Visit (HOSPITAL_BASED_OUTPATIENT_CLINIC_OR_DEPARTMENT_OTHER): Payer: Self-pay | Admitting: Orthopaedic Surgery

## 2022-03-21 ENCOUNTER — Ambulatory Visit (HOSPITAL_BASED_OUTPATIENT_CLINIC_OR_DEPARTMENT_OTHER): Payer: No Typology Code available for payment source | Admitting: Physical Therapy

## 2022-03-21 ENCOUNTER — Encounter (HOSPITAL_BASED_OUTPATIENT_CLINIC_OR_DEPARTMENT_OTHER): Payer: Self-pay | Admitting: Physical Therapy

## 2022-03-21 DIAGNOSIS — M25511 Pain in right shoulder: Secondary | ICD-10-CM

## 2022-03-21 DIAGNOSIS — M25611 Stiffness of right shoulder, not elsewhere classified: Secondary | ICD-10-CM

## 2022-03-21 DIAGNOSIS — S29011A Strain of muscle and tendon of front wall of thorax, initial encounter: Secondary | ICD-10-CM

## 2022-03-21 DIAGNOSIS — M6281 Muscle weakness (generalized): Secondary | ICD-10-CM

## 2022-03-21 NOTE — Therapy (Signed)
OUTPATIENT PHYSICAL THERAPY UPPER EXTREMITY TREATMENT   Patient Name: Brandon Lawson MRN: [id number] DOB:[birth year], 47 y.o., male Today's Date: 03/21/2022  END OF SESSION:  PT End of Session - 03/21/22 1010     Visit Number 4    Number of Visits 25    Date for PT Re-Evaluation 05/29/22    Authorization Type Worker's Comp    PT Start Time [phone]    PT Stop Time 1009    PT Time Calculation (min) 38 min    Equipment Utilized During Treatment --   Sling with ABD pillow   Activity Tolerance Patient tolerated treatment well    Behavior During Therapy WFL for tasks assessed/performed                Past Medical History:  Diagnosis Date   Asthma    seasonal only- no inhaler   HLD (hyperlipidemia)    no meds, diet controlled   Hyperlipidemia    Past Surgical History:  Procedure Laterality Date   COLONOSCOPY  2023   PECTORALIS TENDON REPAIR Right 02/24/2022   Procedure: RIGHT PECTORALIS MAJOR REPAIR;  Surgeon: Vanetta Mulders, MD;  Location: [facility];  Service: Orthopedics;  Laterality: Right;   Testicular torsion     at age 86 yrs old   Patient Active Problem List   Diagnosis Date Noted   Pectoralis muscle rupture 02/24/2022    PCP:  Wenda Low, MD       REFERRING PROVIDER:  Vanetta Mulders, MD     REFERRING DIAG:  S29.011A (ICD-10-CM) - Rupture of pectoralis major muscle, initial encounter      THERAPY DIAG:  Acute pain of right shoulder  Decreased right shoulder range of motion  Muscle weakness (generalized)  Rationale for Evaluation and Treatment: Rehabilitation  ONSET DATE: 02/24/22 Surgery  Days since surgery: 25   PROCEDURE: 1.  Right pectoralis major repair    SUBJECTIVE:                                                                                                                                                                                      SUBJECTIVE STATEMENT:  Pt states that the lateral pain is now gone. He feels there  was a larger jump in improvement from last session.   Eval: Pt states that pain has been fairly well managed. He has only been taking the aspirin as prescribed. It will ache on occasion but no pain. Moving around will cause it to ache. Sleeps in bed with pillows on an incline. Icing 2x a day. Keeping it on for 30-40 min at a time. Pt denies signs of infection or DVT. Requires heavy  lifting, door breaching, climbing ladders, etc. At work.   PERTINENT HISTORY: N/A  PAIN:  Are you having pain? No: NPRS scale: 0/10   PRECAUTIONS: Shoulder  WEIGHT BEARING RESTRICTIONS: Yes NWB "He will not have an abduction pillow"  FALLS:  Has patient fallen in last 6 months? 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 Complete tear of the distal tendon of the right pectoralis major muscle with medial retraction of approximately 8 cm. 2. Evolving hematoma surrounding the torn tendon stump measuring approximately 5.7 x 4.6 x 4.6 cm.  PATIENT SURVEYS :  FOTO 34 71 @ DC 23 pts MCII UPPER EXTREMITY ROM:    Passive ROM Right eval R 2/5  Shoulder flexion 65 87  Shoulder extension N/A   Shoulder abduction 60 60  Shoulder adduction     Shoulder internal rotation To belly   Shoulder external rotation N/A To neutral  (Blank rows = not tested)    PALPATION:  No TTP noted around incision sights; aquacell bandage in place without signs of erythema, drainage, or excessive edema               TODAY'S TREATMENT:                                                                                                                                         DATE:   2/5  PROM to tolerance flexion (145)  and ABD (100) R GHJ inf and post grade III  Table slide flexion and ABD 5s 10x (scooting away with hand passively resting on table)  Isometrics- flexion, ABD, ext, ER 5s 10x each  3lb curls and triceps kickbacks Supine cane ABD 10s 10x  Supine cane ER 5s 15x Seated flexion foam roller 5s 10x Standing flexion/ L stretch for the R shoulder; swissball on table 5s 10x  Table slide ABD arm passive 5s 10x   2/5  PROM to tolerance flexion and ABD  R GHJ inf and post grade III  Pulley's (full PASSIVE relaxation of R) 2 min flexion Table slide flexion and ABD 5s 10x (scooting away with hand passively resting on table)  Isometrics- flexion, ABD, ext, ER 5s 10x each 2lb curls and triceps kickbacks   2/5  PROM to protocol limits in flexion and ABD   LAD with oscillation at 45 deg of ABD STM to lateral shoulder/deltoid  02/28/22   Exercises - Seated Scapular Retraction  - 2-3 x daily - 7 x weekly - 2 sets - 10 reps - Circular Shoulder Pendulum with Table Support  - 2-3 x daily - 7 x weekly - 1  sets - 20 reps - Flexion-Extension Shoulder Pendulum with Table Support  - 2-3 x daily - 7 x weekly - 1 sets - 20 reps - Horizontal Shoulder Pendulum with Table Support  - 2-3 x daily - 7 x weekly - 1 sets - 20 reps - Seated Wrist Extension with Dumbbell  - 2-3 x daily - 7 x weekly - 3 sets - 10 reps - Seated Wrist Flexion with Dumbbell  - 2-3 x daily - 7 x weekly - 3 sets - 10 reps - Forearm Pronation and Supination with Hammer  - 2-3 x daily - 7 x weekly - 3 sets - 10 reps   PATIENT EDUCATION: Education details: anatomy, exercise progression, DOMS expectations, swelling management, HEP, POC   Person educated: Patient Education method: Explanation, Demonstration, Tactile cues, Verbal cues, and Handouts Education comprehension: verbalized understanding, returned demonstration, verbal cues required, and tactile cues required   HOME EXERCISE PROGRAM:   Access Code: [email or web address]  (electronic) URL: [email or web address] Date: 02/28/2022 Prepared by: Daleen Bo      ASSESSMENT:   CLINICAL IMPRESSION: Pt able to progress PROM to the ROM listed above without pain or irritation to the surgical site. Pt able to progress to next phase of protocol with PROM past 90 deg with 160 flexion and ABD PROM being the goal by 6 wks. HEP updated today. Pt is more limited into ABD than into flexion. Pt advised to start scar mobilization as his stretching feels more superficial in nature. Still well managed pain. Pt would benefit from continued skilled therapy in order to reach goals and maximize functional R UE strength and ROM for full return to PLOF. [place] Kitchen    OBJECTIVE IMPAIRMENTS decreased strength, impaired UE functional use, improper body mechanics, postural dysfunction, pain, and hypermobility .    ACTIVITY LIMITATIONS community activity, occupation, dressing, bathing, self-care, and exercise/recreation .    PERSONAL FACTORS Fitness, 1-2 comorbidities, and Time since onset of injury/illness/exacerbation are also affecting patient's functional outcome.      REHAB POTENTIAL: Good   CLINICAL DECISION MAKING: Stable/uncomplicated   EVALUATION COMPLEXITY: Low   GOALS:     SHORT TERM GOALS: Target Date . 04/11/2022          Pt will become independent with HEP in order to demonstrate synthesis of PT education.     Goal status: INITIAL   2.  Pt will be able to demonstrate PROM of 90 flexion in order to demonstrate functional improvement in UE function for self-care and house hold duties.     Goal status: INITIAL   3.  Pt will be able to demonstrate ability to perform full AROM without discomfort or limitation in order to demonstrate functional improvement in UE function for self-care and house hold duties.    Goal status: INITIAL     LONG TERM GOALS: Target Date 05/23/2022        Pt  will become independent with final HEP in order to demonstrate  synthesis of PT education.     Goal status: INITIAL   2.  Pt will be able to demonstrate ability to perform 5lb shoulder flexion, ABD, and scaption in order to demonstrate functional improvement in UE function for self-care and house hold duties.      Goal status: INITIAL   3.  Pt will be able to demonstrate 5x full push ups without pain in order to demonstrate functional improvement in UE function for progression towards return to exercise and ability to push during normal daily activity.  Goal status: INITIAL   4.  Pt will be able to reach, lift, and carry OH >25lbs  in order to demonstrate functional improvement in R UE strength for transition to return to occupation.     Goal status: INITIAL     PLAN: PT FREQUENCY: 1-2x/week   PT DURATION: 12 weeks   PLANNED INTERVENTIONS: Therapeutic exercises, Therapeutic activity, Neuromuscular re-education, Balance training, Gait training, Patient/Family education, Self Care, Joint mobilization, Joint manipulation, DME instructions, Aquatic Therapy, Dry Needling, Electrical stimulation, Spinal manipulation, Spinal mobilization, Cryotherapy, Moist heat, scar mobilization, Splintting, Taping, Vasopneumatic device, Traction, Ultrasound, Ionotophoresis 72m/ml Dexamethasone, Manual therapy, and Re-evaluation   PLAN FOR NEXT SESSION: [facility]     ADaleen Bo PT 03/21/2022, 10:11 AM

## 2022-03-22 ENCOUNTER — Encounter (HOSPITAL_BASED_OUTPATIENT_CLINIC_OR_DEPARTMENT_OTHER): Payer: Self-pay | Admitting: Physical Therapy

## 2022-03-22 ENCOUNTER — Ambulatory Visit (HOSPITAL_BASED_OUTPATIENT_CLINIC_OR_DEPARTMENT_OTHER): Payer: No Typology Code available for payment source | Admitting: Physical Therapy

## 2022-03-22 DIAGNOSIS — M6281 Muscle weakness (generalized): Secondary | ICD-10-CM

## 2022-03-22 DIAGNOSIS — M25511 Pain in right shoulder: Secondary | ICD-10-CM

## 2022-03-22 DIAGNOSIS — M25611 Stiffness of right shoulder, not elsewhere classified: Secondary | ICD-10-CM

## 2022-03-22 NOTE — Therapy (Signed)
OUTPATIENT PHYSICAL THERAPY UPPER EXTREMITY TREATMENT   Patient Name: Brandon Lawson MRN: [id number] DOB:1975/09/05, 47 y.o., male Today's Date: 03/22/2022  END OF SESSION:  PT End of Session - 03/22/22 1421     Visit Number 5    Number of Visits 25    Date for PT Re-Evaluation 05/29/22    Authorization Type Worker's Comp    PT Start Time 1350    PT Stop Time 1418    PT Time Calculation (min) 28 min    Equipment Utilized During Treatment --   Sling with ABD pillow   Activity Tolerance Patient tolerated treatment well    Behavior During Therapy WFL for tasks assessed/performed                 Past Medical History:  Diagnosis Date   Asthma    seasonal only- no inhaler   HLD (hyperlipidemia)    no meds, diet controlled   Hyperlipidemia    Past Surgical History:  Procedure Laterality Date   COLONOSCOPY  2023   PECTORALIS TENDON REPAIR Right 02/24/2022   Procedure: RIGHT PECTORALIS MAJOR REPAIR;  Surgeon: Vanetta Mulders, MD;  Location: [employer];  Service: Orthopedics;  Laterality: Right;   Testicular torsion     at age 54 yrs old   Patient Active Problem List   Diagnosis Date Noted   Pectoralis muscle rupture 02/24/2022    PCP:  Wenda Low, MD       REFERRING PROVIDER:  Vanetta Mulders, MD     REFERRING DIAG:  S29.011A (ICD-10-CM) - Rupture of pectoralis major muscle, initial encounter      THERAPY DIAG:  Acute pain of right shoulder  Decreased right shoulder range of motion  Muscle weakness (generalized)  Rationale for Evaluation and Treatment: Rehabilitation  ONSET DATE: 02/24/22 Surgery  Days since surgery: 26   PROCEDURE: 1.  Right pectoralis major repair    SUBJECTIVE:                                                                                                                                                                                      SUBJECTIVE STATEMENT:  Pt states the shoulder is just sore today. He states the ABD  stretch is the hardest direction.   Eval: Pt states that pain has been fairly well managed. He has only been taking the aspirin as prescribed. It will ache on occasion but no pain. Moving around will cause it to ache. Sleeps in bed with pillows on an incline. Icing 2x a day. Keeping it on for 30-40 min at a time. Pt denies signs of infection or DVT. Requires heavy lifting, door breaching,  climbing ladders, etc. At work.   PERTINENT HISTORY: N/A  PAIN:  Are you having pain? No: NPRS scale: 0/10   PRECAUTIONS: Shoulder  WEIGHT BEARING RESTRICTIONS: Yes NWB "He will not have an abduction pillow"  FALLS:  Has patient fallen in last 6 months? 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 Complete tear of the distal tendon of the right pectoralis major muscle with medial retraction of approximately 8 cm. 2. Evolving hematoma surrounding the torn tendon stump measuring approximately 5.7 x 4.6 x 4.6 cm.  PATIENT SURVEYS :  FOTO 34 71 @ DC 23 pts MCII UPPER EXTREMITY ROM:    Passive ROM Right eval R 2/5  Shoulder flexion 65 87  Shoulder extension N/A   Shoulder abduction 60 60  Shoulder adduction     Shoulder internal rotation To belly   Shoulder external rotation N/A To neutral  (Blank rows = not tested)    PALPATION:  No TTP noted around incision sights; aquacell bandage in place without signs of erythema, drainage, or excessive edema               TODAY'S TREATMENT:                                                                                                                                         DATE:   2/20  PROM to tolerance flexion (148) and ABD (102);  alt  R GHJ inf and post grade III  Scar tissue mobilization about the R pec and scar site   2/5  PROM to tolerance flexion (145) and ABD (100) R GHJ inf and post grade III  Table slide flexion and ABD 5s 10x (scooting away with hand passively resting on table)  Isometrics- flexion, ABD, ext, ER 5s 10x each  3lb curls and triceps kickbacks Supine cane ABD 10s 10x  Supine cane ER 5s 15x Seated flexion foam roller 5s 10x Standing flexion/ L stretch for the R shoulder; swissball on table 5s 10x  Table slide ABD arm passive 5s 10x   2/5  PROM to tolerance flexion and ABD  R GHJ inf and post grade III  Pulley's (full PASSIVE relaxation of R) 2 min flexion Table slide flexion and ABD 5s 10x (scooting away with hand passively resting on table)  Isometrics- flexion, ABD, ext, ER 5s 10x each 2lb curls and triceps kickbacks   2/5  PROM to protocol limits in flexion and ABD   LAD with oscillation at 45 deg of ABD STM to lateral shoulder/deltoid  02/28/22   Exercises - Seated Scapular Retraction  - 2-3 x  daily - 7 x weekly - 2 sets - 10 reps - Circular Shoulder Pendulum with Table Support  - 2-3 x daily - 7 x weekly - 1 sets - 20 reps - Flexion-Extension Shoulder Pendulum with Table Support  - 2-3 x daily - 7 x weekly - 1 sets - 20 reps - Horizontal Shoulder Pendulum with Table Support  - 2-3 x daily - 7 x weekly - 1 sets - 20 reps - Seated Wrist Extension with Dumbbell  - 2-3 x daily - 7 x weekly - 3 sets - 10 reps - Seated Wrist Flexion with Dumbbell  - 2-3 x daily - 7 x weekly - 3 sets - 10 reps - Forearm Pronation and Supination with Hammer  - 2-3 x daily - 7 x weekly - 3 sets - 10 reps   PATIENT EDUCATION: Education details: anatomy, exercise progression, DOMS expectations, swelling management, HEP, POC   Person educated: Patient Education method: Explanation, Demonstration, Tactile cues, Verbal cues, and Handouts Education comprehension: verbalized understanding,  returned demonstration, verbal cues required, and tactile cues required   HOME EXERCISE PROGRAM:   Access Code: [email or web address] (electronic) URL: [email or web address] Date: 02/28/2022 Prepared by: Daleen Bo      ASSESSMENT:   CLINICAL IMPRESSION: Pt continues to be limited into ABD as compared to flexion but has less soft tissue restriction at today's visit vs previous visit. Pt still without pain during session but able to improve joint stiffness following manual and PROM.  Per protocol, 160 flexion and ABD PROM are the goals by 6 wks. Pt would benefit from continued skilled therapy in order to reach goals and maximize functional R UE strength and ROM for full return to PLOF.    OBJECTIVE IMPAIRMENTS decreased strength, impaired UE functional use, improper body mechanics, postural dysfunction, pain, and hypermobility .    ACTIVITY LIMITATIONS community activity, occupation, dressing, bathing, self-care, and exercise/recreation .    PERSONAL FACTORS Fitness, 1-2 comorbidities, and Time since onset of injury/illness/exacerbation are also affecting patient's functional outcome.      REHAB POTENTIAL: Good   CLINICAL DECISION MAKING: Stable/uncomplicated   EVALUATION COMPLEXITY: Low   GOALS:     SHORT TERM GOALS: Target Date . 04/11/2022          Pt will become independent with HEP in order to demonstrate synthesis of PT education.     Goal status: INITIAL   2.  Pt will be able to demonstrate PROM of 90 flexion in order to demonstrate functional improvement in UE function for self-care and house hold duties.     Goal status: INITIAL   3.  Pt will be able to demonstrate ability to perform full AROM without discomfort or limitation in order to demonstrate functional improvement in UE function for self-care and house hold duties.    Goal status: INITIAL     LONG TERM GOALS: Target Date 05/23/2022        Pt  will become independent with final HEP in order to  demonstrate synthesis of PT education.     Goal status: INITIAL   2.  Pt will be able to demonstrate ability to perform 5lb shoulder flexion, ABD, and scaption in order to demonstrate functional improvement in UE function for self-care and house hold duties.      Goal status: INITIAL   3.  Pt will be able to demonstrate 5x full push ups without pain in order to demonstrate functional improvement in UE function for progression towards return to exercise and  ability to push during normal daily activity.      Goal status: INITIAL   4.  Pt will be able to reach, lift, and carry OH >25lbs  in order to demonstrate functional improvement in R UE strength for transition to return to occupation.     Goal status: INITIAL     PLAN: PT FREQUENCY: 1-2x/week   PT DURATION: 12 weeks   PLANNED INTERVENTIONS: Therapeutic exercises, Therapeutic activity, Neuromuscular re-education, Balance training, Gait training, Patient/Family education, Self Care, Joint mobilization, Joint manipulation, DME instructions, Aquatic Therapy, Dry Needling, Electrical stimulation, Spinal manipulation, Spinal mobilization, Cryotherapy, Moist heat, scar mobilization, Splintting, Taping, Vasopneumatic device, Traction, Ultrasound, Ionotophoresis 23m/ml Dexamethasone, Manual therapy, and Re-evaluation   PLAN FOR NEXT SESSION: [facility]     ADaleen Bo PT 03/22/2022, 2:24 PM

## 2022-03-26 DIAGNOSIS — M25611 Stiffness of right shoulder, not elsewhere classified: Secondary | ICD-10-CM | POA: Diagnosis present

## 2022-03-26 DIAGNOSIS — M6281 Muscle weakness (generalized): Secondary | ICD-10-CM | POA: Diagnosis present

## 2022-03-26 DIAGNOSIS — M25511 Pain in right shoulder: Secondary | ICD-10-CM | POA: Diagnosis present

## 2022-03-29 ENCOUNTER — Encounter (HOSPITAL_BASED_OUTPATIENT_CLINIC_OR_DEPARTMENT_OTHER): Payer: Self-pay | Admitting: Physical Therapy

## 2022-03-29 ENCOUNTER — Ambulatory Visit (HOSPITAL_BASED_OUTPATIENT_CLINIC_OR_DEPARTMENT_OTHER): Payer: No Typology Code available for payment source | Admitting: Physical Therapy

## 2022-03-29 DIAGNOSIS — M25511 Pain in right shoulder: Secondary | ICD-10-CM | POA: Diagnosis not present

## 2022-03-29 DIAGNOSIS — M6281 Muscle weakness (generalized): Secondary | ICD-10-CM

## 2022-03-29 DIAGNOSIS — M25611 Stiffness of right shoulder, not elsewhere classified: Secondary | ICD-10-CM

## 2022-03-29 NOTE — Therapy (Signed)
OUTPATIENT PHYSICAL THERAPY UPPER EXTREMITY TREATMENT   Patient Name: Brandon Lawson MRN: [id number] DOB:03/01/1975, 47 y.o., male Today's Date: 03/29/2022  END OF SESSION:  PT End of Session - 03/29/22 1420     Visit Number 6    Number of Visits 25    Date for PT Re-Evaluation 05/29/22    Authorization Type Worker's Comp    PT Start Time 1350    PT Stop Time 1418    PT Time Calculation (min) 28 min    Equipment Utilized During Treatment --   Sling with ABD pillow   Activity Tolerance Patient tolerated treatment well    Behavior During Therapy WFL for tasks assessed/performed                  Past Medical History:  Diagnosis Date   Asthma    seasonal only- no inhaler   HLD (hyperlipidemia)    no meds, diet controlled   Hyperlipidemia    Past Surgical History:  Procedure Laterality Date   COLONOSCOPY  2023   PECTORALIS TENDON REPAIR Right 02/24/2022   Procedure: RIGHT PECTORALIS MAJOR REPAIR;  Surgeon: Vanetta Mulders, MD;  Location: [place];  Service: Orthopedics;  Laterality: Right;   Testicular torsion     at age 89 yrs old   Patient Active Problem List   Diagnosis Date Noted   Pectoralis muscle rupture 02/24/2022    PCP:  Wenda Low, MD       REFERRING PROVIDER:  Vanetta Mulders, MD     REFERRING DIAG:  S29.011A (ICD-10-CM) - Rupture of pectoralis major muscle, initial encounter      THERAPY DIAG:  Acute pain of right shoulder  Decreased right shoulder range of motion  Muscle weakness (generalized)  Rationale for Evaluation and Treatment: Rehabilitation  ONSET DATE: 02/24/22 Surgery  Days since surgery: 33   PROCEDURE: 1.  Right pectoralis major repair    SUBJECTIVE:                                                                                                                                                                                      SUBJECTIVE STATEMENT:  Pt states that the shoulder feels tight today. He was out  over the weekend and it did not get to do HEP. He noticed it did tighten some.   Eval: Pt states that pain has been fairly well managed. He has only been taking the aspirin as prescribed. It will ache on occasion but no pain. Moving around will cause it to ache. Sleeps in bed with pillows on an incline. Icing 2x a day. Keeping it on for 30-40 min at a time.  Pt denies signs of infection or DVT. Requires heavy lifting, door breaching, climbing ladders, etc. At work.   PERTINENT HISTORY: N/A  PAIN:  Are you having pain? No: NPRS scale: 0/10   PRECAUTIONS: Shoulder  WEIGHT BEARING RESTRICTIONS: Yes NWB "He will not have an abduction pillow"  FALLS:  Has patient fallen in last 6 months? 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 Complete tear of the distal tendon of the right pectoralis major muscle with medial retraction of approximately 8 cm. 2. Evolving hematoma surrounding the torn tendon stump measuring approximately 5.7 x 4.6 x 4.6 cm.  PATIENT SURVEYS :  FOTO 34 71 @ DC 23 pts MCII UPPER EXTREMITY ROM:    Passive ROM Right eval R 2/5  Shoulder flexion 65 87  Shoulder extension N/A   Shoulder abduction 60 60  Shoulder adduction     Shoulder internal rotation To belly   Shoulder external rotation N/A To neutral  (Blank rows = not tested)    PALPATION:  No TTP noted around incision sights; aquacell bandage in place without signs of erythema, drainage, or excessive edema               TODAY'S TREATMENT:                                                                                                                                         DATE:    2/27  PROM to tolerance flexion (155) and ABD (120)  R GHJ inf grade III  Program Notes No Adduction or Internal rotation/hand behind backNo Active ROM or lifting with the armROM max of 160 when going OH  Exercises - Shoulder Flexion Overhead with Dowel  - 2 x daily - 7 x weekly - 2 sets - 10 reps - 5 hold - Shoulder Scaption AAROM with Dowel  - 2 x daily - 7 x weekly - 2 sets - 10 reps - 5 hold - Standing Shoulder Abduction AAROM with Dowel  - 2 x daily - 7 x weekly - 2 sets - 10 reps - 5 hold - Standing Shoulder Extension with Dowel  - 1 x daily - 7 x weekly - 2 sets - 10 reps - 5 hold - Doorway Pec Stretch at 60 Degrees Abduction with Arm Straight  - 2 x daily - 7 x weekly - 1 sets - 3 reps - 30 hold - Standing Shoulder Row with Anchored Resistance  - 1 x daily - 7 x weekly - 3 sets - 10 reps - Shoulder External Rotation with Anchored Resistance  - 1 x daily -  7 x weekly - 3 sets - 10 reps - Standing Bicep Curls Supinated with Dumbbells  - 1 x daily - 7 x weekly - 3 sets - 10 reps - Standing Bent Over Triceps Extension  - 1 x daily - 7 x weekly - 3 sets - 10 reps  2/20  PROM to tolerance flexion (148) and ABD (102); alt  R GHJ inf and post grade III  Scar tissue mobilization about the R pec and scar site   2/5  PROM to tolerance flexion (145) and ABD (100) R GHJ inf and post grade III  Table slide flexion and ABD 5s 10x (scooting away with hand passively resting on table)  Isometrics- flexion, ABD, ext, ER 5s 10x each  3lb curls and triceps kickbacks Supine cane ABD 10s 10x  Supine cane ER 5s 15x Seated flexion foam roller 5s 10x Standing flexion/ L stretch for the R shoulder; swissball on table 5s 10x  Table slide ABD arm passive 5s 10x     PATIENT EDUCATION: Education details: anatomy, exercise progression, DOMS expectations, swelling management, HEP, POC   Person educated: Patient Education method: Explanation, Demonstration, Tactile cues, Verbal cues, and  Handouts Education comprehension: verbalized understanding, returned demonstration, verbal cues required, and tactile cues required   HOME EXERCISE PROGRAM:   Access Code: [email or web address] (electronic) URL: [email or web address] Date: 02/28/2022 Prepared by: Daleen Bo      ASSESSMENT:   CLINICAL IMPRESSION: Pt nearly at 5 wks at this time. Per protocol, AAROM progressed with good tolerance with pt able to reach near protocol limits with self stretching. Pt advised to avoid curling or triceps of >5 lbs at this time in order to reduce pec activation. Pt also given light antagonist strengthening of the pec without pain or discomfort. Pt progressing well with current rehab. Pain is well managed. Per protocol, 160 flexion and ABD PROM are the goals by 6 wks. Pt would benefit from continued skilled therapy in order to reach goals and maximize functional R UE strength and ROM for full return to PLOF.    OBJECTIVE IMPAIRMENTS decreased strength, impaired UE functional use, improper body mechanics, postural dysfunction, pain, and hypermobility .    ACTIVITY LIMITATIONS community activity, occupation, dressing, bathing, self-care, and exercise/recreation .    PERSONAL FACTORS Fitness, 1-2 comorbidities, and Time since onset of injury/illness/exacerbation are also affecting patient's functional outcome.      REHAB POTENTIAL: Good   CLINICAL DECISION MAKING: Stable/uncomplicated   EVALUATION COMPLEXITY: Low   GOALS:     SHORT TERM GOALS: Target Date . 04/11/2022          Pt will become independent with HEP in order to demonstrate synthesis of PT education.     Goal status: INITIAL   2.  Pt will be able to demonstrate PROM of 90 flexion in order to demonstrate functional improvement in UE function for self-care and house hold duties.     Goal status: INITIAL   3.  Pt will be able to demonstrate ability to perform full AROM without discomfort or limitation in order to  demonstrate functional improvement in UE function for self-care and house hold duties.    Goal status: INITIAL     LONG TERM GOALS: Target Date 05/23/2022        Pt  will become independent with final HEP in order to demonstrate synthesis of PT education.     Goal status: INITIAL   2.  Pt will be able to demonstrate ability to perform 5lb  shoulder flexion, ABD, and scaption in order to demonstrate functional improvement in UE function for self-care and house hold duties.      Goal status: INITIAL   3.  Pt will be able to demonstrate 5x full push ups without pain in order to demonstrate functional improvement in UE function for progression towards return to exercise and ability to push during normal daily activity.      Goal status: INITIAL   4.  Pt will be able to reach, lift, and carry OH >25lbs  in order to demonstrate functional improvement in R UE strength for transition to return to occupation.     Goal status: INITIAL     PLAN: PT FREQUENCY: 1-2x/week   PT DURATION: 12 weeks   PLANNED INTERVENTIONS: Therapeutic exercises, Therapeutic activity, Neuromuscular re-education, Balance training, Gait training, Patient/Family education, Self Care, Joint mobilization, Joint manipulation, DME instructions, Aquatic Therapy, Dry Needling, Electrical stimulation, Spinal manipulation, Spinal mobilization, Cryotherapy, Moist heat, scar mobilization, Splintting, Taping, Vasopneumatic device, Traction, Ultrasound, Ionotophoresis '4mg'$ /ml Dexamethasone, Manual therapy, and Re-evaluation   PLAN FOR NEXT SESSION: continue per [facility] repair protocol     Daleen Bo, PT 03/29/2022, 2:21 PM

## 2022-04-01 ENCOUNTER — Encounter (HOSPITAL_BASED_OUTPATIENT_CLINIC_OR_DEPARTMENT_OTHER): Payer: 59

## 2022-04-05 ENCOUNTER — Encounter (HOSPITAL_BASED_OUTPATIENT_CLINIC_OR_DEPARTMENT_OTHER): Payer: Self-pay | Admitting: Physical Therapy

## 2022-04-05 ENCOUNTER — Ambulatory Visit (HOSPITAL_BASED_OUTPATIENT_CLINIC_OR_DEPARTMENT_OTHER): Payer: No Typology Code available for payment source | Attending: Orthopaedic Surgery | Admitting: Physical Therapy

## 2022-04-05 DIAGNOSIS — M25511 Pain in right shoulder: Secondary | ICD-10-CM | POA: Diagnosis present

## 2022-04-05 DIAGNOSIS — M25611 Stiffness of right shoulder, not elsewhere classified: Secondary | ICD-10-CM | POA: Diagnosis present

## 2022-04-05 DIAGNOSIS — M6281 Muscle weakness (generalized): Secondary | ICD-10-CM

## 2022-04-05 NOTE — Therapy (Signed)
OUTPATIENT PHYSICAL THERAPY UPPER EXTREMITY TREATMENT   Patient Name: Brandon Lawson MRN: [id number] DOB:23-Jun-1975, 47 y.o., male Today's Date: 04/05/2022  END OF SESSION:  PT End of Session - 04/05/22 1359     Visit Number 7    Number of Visits 25    Date for PT Re-Evaluation 05/29/22    Authorization Type Worker's Comp    PT Start Time 1318    PT Stop Time 1356    PT Time Calculation (min) 38 min    Equipment Utilized During Treatment --   Sling with ABD pillow   Activity Tolerance Patient tolerated treatment well    Behavior During Therapy WFL for tasks assessed/performed                   Past Medical History:  Diagnosis Date   Asthma    seasonal only- no inhaler   HLD (hyperlipidemia)    no meds, diet controlled   Hyperlipidemia    Past Surgical History:  Procedure Laterality Date   COLONOSCOPY  2023   PECTORALIS TENDON REPAIR Right 02/24/2022   Procedure: RIGHT PECTORALIS MAJOR REPAIR;  Surgeon: Vanetta Mulders, MD;  Location: [facility];  Service: Orthopedics;  Laterality: Right;   Testicular torsion     at age 46 yrs old   Patient Active Problem List   Diagnosis Date Noted   Pectoralis muscle rupture 02/24/2022    PCP:  Wenda Low, MD       REFERRING PROVIDER:  Vanetta Mulders, MD     REFERRING DIAG:  S29.011A (ICD-10-CM) - Rupture of pectoralis major muscle, initial encounter      THERAPY DIAG:  Acute pain of right shoulder  Decreased right shoulder range of motion  Muscle weakness (generalized)  Rationale for Evaluation and Treatment: Rehabilitation  ONSET DATE: 02/24/22 Surgery  Days since surgery: 40   PROCEDURE: 1.  Right pectoralis major repair    SUBJECTIVE:                                                                                                                                                                                      SUBJECTIVE STATEMENT:  Pt states that motion is better now. He feels it is  improving without pain. He feels the shoulder is progressing more normally.  Eval: Pt states that pain has been fairly well managed. He has only been taking the aspirin as prescribed. It will ache on occasion but no pain. Moving around will cause it to ache. Sleeps in bed with pillows on an incline. Icing 2x a day. Keeping it on for 30-40 min at a time. Pt denies signs of infection or  DVT. Requires heavy lifting, door breaching, climbing ladders, etc. At work.   PERTINENT HISTORY: N/A  PAIN:  Are you having pain? No: NPRS scale: 0/10   PRECAUTIONS: Shoulder  WEIGHT BEARING RESTRICTIONS: Yes NWB "He will not have an abduction pillow"  FALLS:  Has patient fallen in last 6 months? 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 Complete tear of the distal tendon of the right pectoralis major muscle with medial retraction of approximately 8 cm. 2. Evolving hematoma surrounding the torn tendon stump measuring approximately 5.7 x 4.6 x 4.6 cm.  PATIENT SURVEYS :  FOTO 34 71 @ DC 23 pts [facility]II UPPER EXTREMITY ROM:    Passive ROM Right eval R 2/5 R  3/5  Shoulder flexion 65 87 160  Shoulder extension N/A  35  Shoulder abduction 60 60 135  Shoulder adduction      Shoulder internal rotation To belly  To belly  Shoulder external rotation N/A To neutral 35  (Blank rows = not tested)    PALPATION:  No TTP noted around incision sights; steri-strips removed               TODAY'S TREATMENT:                                                                                                                                         DATE:   3/5 R GHJ inf and post grade III-IV STM pec  into ABD 90 deg  Row and curls 10-15lbs Shoulder raises 3lbs   Wall slide pec stretch 5s 10x - Doorway Pec Stretch at 90 Degrees Abduction  - 2 x daily - 7 x weekly - 1 sets - 3 reps - 30 hold - Standing Shoulder Internal Rotation Stretch with Towel  - 2 x daily - 7 x weekly - 1 sets - 3 reps - 30 hold - Standing Shoulder Abduction AAROM with Dowel  - 2 x daily - 7 x weekly - 2 sets - 10 reps - 5 hold - Bent Over Single Arm Shoulder Row with Dumbbell  - 1 x daily - 3 x weekly - 3 sets - 10 reps - Standing Bicep Curls Supinated with Dumbbells  - 1 x daily - 3 x weekly - 3 sets - 10 reps - Triceps Extension   - 1 x daily - 3 x weekly - 3 sets - 10 reps - Shoulder Overhead Press in Flexion with Dumbbells  - 1 x daily - 3 x weekly - 3 sets - 10 reps - Standing Shoulder External Rotation with Resistance  - 1 x daily -  3 x weekly - 3 sets - 10 reps - Shoulder Abduction with Dumbbells - Palms Down  - 1 x daily - 3 x weekly - 3 sets - 10 reps  2/27  PROM to tolerance flexion (155) and ABD (120)  R GHJ inf grade III  Program Notes No Adduction or Internal rotation/hand behind backNo Active ROM or lifting with the armROM max of 160 when going OH  Exercises - Shoulder Flexion Overhead with Dowel  - 2 x daily - 7 x weekly - 2 sets - 10 reps - 5 hold - Shoulder Scaption AAROM with Dowel  - 2 x daily - 7 x weekly - 2 sets - 10 reps - 5 hold - Standing Shoulder Abduction AAROM with Dowel  - 2 x daily - 7 x weekly - 2 sets - 10 reps - 5 hold - Standing Shoulder Extension with Dowel  - 1 x daily - 7 x weekly - 2 sets - 10 reps - 5 hold - Doorway Pec Stretch at 60 Degrees Abduction with Arm Straight  - 2 x daily - 7 x weekly - 1 sets - 3 reps - 30 hold - Standing Shoulder Row with Anchored Resistance  - 1 x daily - 7 x weekly - 3 sets - 10 reps - Shoulder External Rotation with Anchored Resistance  - 1 x daily - 7 x weekly - 3 sets - 10 reps - Standing Bicep Curls Supinated with Dumbbells  - 1 x daily  - 7 x weekly - 3 sets - 10 reps - Standing Bent Over Triceps Extension  - 1 x daily - 7 x weekly - 3 sets - 10 reps  2/20  PROM to tolerance flexion (148) and ABD (102); alt  R GHJ inf and post grade III  Scar tissue mobilization about the R pec and scar site   2/5  PROM to tolerance flexion (145) and ABD (100) R GHJ inf and post grade III  Table slide flexion and ABD 5s 10x (scooting away with hand passively resting on table)  Isometrics- flexion, ABD, ext, ER 5s 10x each  3lb curls and triceps kickbacks Supine cane ABD 10s 10x  Supine cane ER 5s 15x Seated flexion foam roller 5s 10x Standing flexion/ L stretch for the R shoulder; swissball on table 5s 10x  Table slide ABD arm passive 5s 10x     PATIENT EDUCATION: Education details: anatomy, exercise progression, DOMS expectations, HEP, POC   Person educated: Patient Education method: Explanation, Demonstration, Tactile cues, Verbal cues, and Handouts Education comprehension: verbalized understanding, returned demonstration, verbal cues required, and tactile cues required   HOME EXERCISE PROGRAM:   Access Code: [email or web address] (electronic) URL: [email or web address] Date: 02/28/2022 Prepared by: Daleen Bo      ASSESSMENT:   CLINICAL IMPRESSION: Pt nearly at 6 wks at this time. Pt able to reach protocol limits today with flexion. Pt is 2 days shy of 6 wks so HEP progressed today and pt without irritation of the R pec or shoulder. Pt advised to keep weights in the gym low enough where there is no pec activation until we progress to more UE WB and CKC motions. Pt without pain during session and had good improvement in R shoulder mobility following manual therapy. Pt would benefit from continued skilled therapy in order to reach goals and maximize functional R UE strength and ROM for full return to PLOF.    OBJECTIVE IMPAIRMENTS decreased strength, impaired UE functional use, improper body mechanics, postural  dysfunction, pain, and hypermobility .    ACTIVITY LIMITATIONS community activity, occupation, dressing, bathing, self-care, and exercise/recreation .    PERSONAL FACTORS Fitness, 1-2 comorbidities, and Time since onset of injury/illness/exacerbation are also affecting patient's functional outcome.      REHAB POTENTIAL: Good   CLINICAL DECISION MAKING: Stable/uncomplicated   EVALUATION COMPLEXITY: Low   GOALS:     SHORT TERM GOALS: Target Date . 04/11/2022          Pt will become independent with HEP in order to demonstrate synthesis of PT education.     Goal status: INITIAL   2.  Pt will be able to demonstrate PROM of 90 flexion in order to demonstrate functional improvement in UE function for self-care and house hold duties.     Goal status: INITIAL   3.  Pt will be able to demonstrate ability to perform full AROM without discomfort or limitation in order to demonstrate functional improvement in UE function for self-care and house hold duties.    Goal status: INITIAL     LONG TERM GOALS: Target Date 05/23/2022        Pt  will become independent with final HEP in order to demonstrate synthesis of PT education.     Goal status: INITIAL   2.  Pt will be able to demonstrate ability to perform 5lb shoulder flexion, ABD, and scaption in order to demonstrate functional improvement in UE function for self-care and house hold duties.      Goal status: INITIAL   3.  Pt will be able to demonstrate 5x full push ups without pain in order to demonstrate functional improvement in UE function for progression towards return to exercise and ability to push during normal daily activity.      Goal status: INITIAL   4.  Pt will be able to reach, lift, and carry OH >25lbs  in order to demonstrate functional improvement in R UE strength for transition to return to occupation.     Goal status: INITIAL     PLAN: PT FREQUENCY: 1-2x/week   PT DURATION: 12 weeks   PLANNED  INTERVENTIONS: Therapeutic exercises, Therapeutic activity, Neuromuscular re-education, Balance training, Gait training, Patient/Family education, Self Care, Joint mobilization, Joint manipulation, DME instructions, Aquatic Therapy, Dry Needling, Electrical stimulation, Spinal manipulation, Spinal mobilization, Cryotherapy, Moist heat, scar mobilization, Splintting, Taping, Vasopneumatic device, Traction, Ultrasound, Ionotophoresis '4mg'$ /ml Dexamethasone, Manual therapy, and Re-evaluation   PLAN FOR NEXT SESSION: continue per [facility] repair protocol     Daleen Bo, PT 04/05/2022, 2:05 PM

## 2022-04-07 DIAGNOSIS — M25611 Stiffness of right shoulder, not elsewhere classified: Secondary | ICD-10-CM | POA: Diagnosis present

## 2022-04-07 DIAGNOSIS — M25511 Pain in right shoulder: Secondary | ICD-10-CM | POA: Diagnosis present

## 2022-04-07 DIAGNOSIS — M6281 Muscle weakness (generalized): Secondary | ICD-10-CM | POA: Diagnosis present

## 2022-04-08 ENCOUNTER — Encounter (HOSPITAL_BASED_OUTPATIENT_CLINIC_OR_DEPARTMENT_OTHER): Payer: Self-pay

## 2022-04-08 ENCOUNTER — Ambulatory Visit (HOSPITAL_BASED_OUTPATIENT_CLINIC_OR_DEPARTMENT_OTHER): Payer: No Typology Code available for payment source

## 2022-04-08 ENCOUNTER — Ambulatory Visit (INDEPENDENT_AMBULATORY_CARE_PROVIDER_SITE_OTHER): Payer: 59 | Admitting: Orthopaedic Surgery

## 2022-04-08 DIAGNOSIS — S29011A Strain of muscle and tendon of front wall of thorax, initial encounter: Secondary | ICD-10-CM

## 2022-04-08 DIAGNOSIS — M25611 Stiffness of right shoulder, not elsewhere classified: Secondary | ICD-10-CM

## 2022-04-08 DIAGNOSIS — M25511 Pain in right shoulder: Secondary | ICD-10-CM

## 2022-04-08 DIAGNOSIS — M6281 Muscle weakness (generalized): Secondary | ICD-10-CM

## 2022-04-08 NOTE — Progress Notes (Signed)
Post Operative Evaluation    Procedure/Date of Surgery: Right pectoralis major repair 02/24/22  Interval History:    Presents today for follow-up of his right pectoralis major repair.  Overall he is doing very well.  His motion is normalized.  He has minimal pain.  He is working on Environmental education officer.   PMH/PSH/Family History/Social History/Meds/Allergies:    Past Medical History:  Diagnosis Date   Asthma    seasonal only- no inhaler   HLD (hyperlipidemia)    no meds, diet controlled   Hyperlipidemia    Past Surgical History:  Procedure Laterality Date   COLONOSCOPY  2023   PECTORALIS TENDON REPAIR Right 02/24/2022   Procedure: RIGHT PECTORALIS MAJOR REPAIR;  Surgeon: Vanetta Mulders, MD;  Location: [place];  Service: Orthopedics;  Laterality: Right;   Testicular torsion     at age 47 yrs old   Social History   Socioeconomic History   Marital status: Married    Spouse name: Not on file   Number of children: 3   Years of education: Not on file   Highest education level: Bachelor's degree (e.g., BA, AB, BS)  Occupational History   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[employer]    Employer: [employer]  Tobacco Use   Smoking status: Former   Smokeless tobacco: Current    Types: Chew   Tobacco comments:    every now and then  while in college only  Vaping Use   Vaping Use: Never used  Substance and Sexual Activity   Alcohol use: Yes    Alcohol/week: 3.0 - 5.0 standard drinks of alcohol    Types: 3 - 5 Glasses of wine per week    Comment: Socially    Drug use: No   Sexual activity: Yes  Other Topics Concern   Not on file  Social History Narrative   Not on file   Social Determinants of Health   Financial Resource Strain: Not on file  Food Insecurity: Not on file  Transportation Needs: Not on file  Physical Activity: Sufficiently Active (04/14/2017)   Exercise Vital Sign    Days of Exercise per Week: 5 days    Minutes of Exercise per  Session: 30 min  Stress: Not on file  Social Connections: Not on file   Family History  Problem Relation Age of Onset   Hypertension Mother    Hyperlipidemia Mother    Heart attack Father    Heart disease Father    Coronary artery disease Father    Heart attack Maternal Grandmother    No Known Allergies Current Outpatient Medications  Medication Sig Dispense Refill   aspirin EC 325 MG tablet Take 1 tablet (325 mg total) by mouth daily. (Patient taking differently: Take 325 mg by mouth daily. For after surgery) 30 tablet 0   fluticasone (FLONASE) 50 MCG/ACT nasal spray Place 1 spray into both nostrils daily.     naphazoline-glycerin (CLEAR EYES REDNESS) 0.012-0.2 % SOLN Place 1-2 drops into both eyes 4 (four) times daily as needed for eye irritation.     naproxen (NAPROSYN) 500 MG tablet Take 1 tablet (500 mg total) by mouth 2 (two) times daily. (Patient not taking: Reported on 02/21/2022) 30 tablet 0   Omega-3 1000 MG CAPS Take 1,000 mg by mouth daily.     oxyCODONE (OXY IR/ROXICODONE)  5 MG immediate release tablet Take 1 tablet (5 mg total) by mouth every 4 (four) hours as needed (severe pain). 20 tablet 0   No current facility-administered medications for this visit.   No results found.  Review of Systems:   A ROS was performed including pertinent positives and negatives as documented in the HPI.   Musculoskeletal Exam:    There were no vitals taken for this visit.  Right shoulder incision is healed.  No palpable defect in the axillary fold.  Improved pectoralis tone.  Forward active elevation is to 170 degrees equal to contralateral side.  External rotation is to 50 degrees bilaterally.  Internal rotation is to L1 bilaterally.  Is able to flex and extend at the right elbow and right wrist.  Fires EPL.  2+ radial pulse  Imaging:      I personally reviewed and interpreted the radiographs.   Assessment:   6 weeks status post right pectoralis major tendon repair overall  doing very well.  At this time we will continue to advance according to the pectoralis major repair protocol.  I will plan to see him back in 6 weeks for reassessment.  All limitations and restrictions were discussed  Plan :    -Return to clinic 6 weeks for reassessment      I personally saw and evaluated the patient, and participated in the management and treatment plan.  Vanetta Mulders, MD Attending Physician, Orthopedic Surgery  This document was dictated using Dragon voice recognition software. A reasonable attempt at proof reading has been made to minimize errors.

## 2022-04-08 NOTE — Therapy (Signed)
OUTPATIENT PHYSICAL THERAPY UPPER EXTREMITY TREATMENT   Patient Name: Brandon Lawson MRN: [id number] DOB:19-Apr-1975, 47 y.o., male Today's Date: 04/08/2022  END OF SESSION:  PT End of Session - 04/08/22 1407     Visit Number 8    Number of Visits 25    Date for PT Re-Evaluation 05/29/22    Authorization Type Worker's Comp    PT Start Time 1350    PT Stop Time 1430    PT Time Calculation (min) 40 min    Activity Tolerance Patient tolerated treatment well    Behavior During Therapy WFL for tasks assessed/performed                    Past Medical History:  Diagnosis Date   Asthma    seasonal only- no inhaler   HLD (hyperlipidemia)    no meds, diet controlled   Hyperlipidemia    Past Surgical History:  Procedure Laterality Date   COLONOSCOPY  2023   PECTORALIS TENDON REPAIR Right 02/24/2022   Procedure: RIGHT PECTORALIS MAJOR REPAIR;  Surgeon: Vanetta Mulders, MD;  Location: [facility];  Service: Orthopedics;  Laterality: Right;   Testicular torsion     at age 24 yrs old   Patient Active Problem List   Diagnosis Date Noted   Pectoralis muscle rupture 02/24/2022    PCP:  Wenda Low, MD       REFERRING PROVIDER:  Vanetta Mulders, MD     REFERRING DIAG:  S29.011A (ICD-10-CM) - Rupture of pectoralis major muscle, initial encounter      THERAPY DIAG:  Acute pain of right shoulder  Muscle weakness (generalized)  Decreased right shoulder range of motion  Rationale for Evaluation and Treatment: Rehabilitation  ONSET DATE: 02/24/22 Surgery  Days since surgery: 43   PROCEDURE: 1.  Right pectoralis major repair    SUBJECTIVE:                                                                                                                                                                                      SUBJECTIVE STATEMENT:  Pt states he had no increase in pain after progressions last visit. Sleeping better.  Eval: Pt states that pain has  been fairly well managed. He has only been taking the aspirin as prescribed. It will ache on occasion but no pain. Moving around will cause it to ache. Sleeps in bed with pillows on an incline. Icing 2x a day. Keeping it on for 30-40 min at a time. Pt denies signs of infection or DVT. Requires heavy lifting, door breaching, climbing ladders, etc. At work.   PERTINENT HISTORY: N/A  PAIN:  Are  you having pain? No: NPRS scale: 0/10   PRECAUTIONS: Shoulder  WEIGHT BEARING RESTRICTIONS: Yes NWB "He will not have an abduction pillow"  FALLS:  Has patient fallen in last 6 months? 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 Complete tear of the distal tendon of the right pectoralis major muscle with medial retraction of approximately 8 cm. 2. Evolving hematoma surrounding the torn tendon stump measuring approximately 5.7 x 4.6 x 4.6 cm.  PATIENT SURVEYS :  FOTO 34 71 @ DC 23 pts MCII UPPER EXTREMITY ROM:    Passive ROM Right eval R 2/5 R  3/5  Shoulder flexion 65 87 160  Shoulder extension N/A  35  Shoulder abduction 60 60 135  Shoulder adduction      Shoulder internal rotation To belly  To belly  Shoulder external rotation N/A To neutral 35  (Blank rows = not tested)    PALPATION:  No TTP noted around incision sights; steri-strips removed               TODAY'S TREATMENT:                                                                                                                                         DATE:   3/8 PROM R shoulder flexion, abd, and extension  Row and curls 10-15lbs Shoulder raises 3lbs   Wall slide pec stretch 5s 10x - Doorway Pec Stretch  at 90 Degrees Abduction  - 2 x daily - 7 x weekly - 1 sets - 3 reps - 30 hold - Standing Shoulder Internal Rotation Stretch with Towel  - 2 x daily - 7 x weekly - 1 sets - 3 reps - 30 hold  -ER with RTB 2x10R -IR with RTB 2x10R -UE wgt shifts at wall x15 -Partial wall push up 2x10 -Bent over row 3x10 10lbR -Cable column single arm row 17.5lbs -Cable column single arm shoulder ext from overhead 2x10L 12.5lbs -Cable column Triceps ext-17.5lbs 2x10 -Cable column adduction to neutral 7.5lbs 2x10  3/5 R GHJ inf and post grade III-IV STM pec into ABD 90 deg  Row and curls 10-15lbs Shoulder raises 3lbs   Wall slide pec stretch 5s 10x - Doorway Pec Stretch at 90 Degrees Abduction  - 2 x daily - 7 x weekly - 1 sets - 3 reps - 30 hold - Standing Shoulder Internal Rotation Stretch with Towel  - 2 x daily - 7 x weekly - 1 sets - 3 reps - 30 hold - Standing Shoulder Abduction AAROM with Dowel  - 2 x daily - 7 x weekly -  2 sets - 10 reps - 5 hold - Bent Over Single Arm Shoulder Row with Dumbbell  - 1 x daily - 3 x weekly - 3 sets - 10 reps - Standing Bicep Curls Supinated with Dumbbells  - 1 x daily - 3 x weekly - 3 sets - 10 reps - Triceps Extension   - 1 x daily - 3 x weekly - 3 sets - 10 reps - Shoulder Overhead Press in Flexion with Dumbbells  - 1 x daily - 3 x weekly - 3 sets - 10 reps - Standing Shoulder External Rotation with Resistance  - 1 x daily - 3 x weekly - 3 sets - 10 reps - Shoulder Abduction with Dumbbells - Palms Down  - 1 x daily - 3 x weekly - 3 sets - 10 reps  2/27  PROM to tolerance flexion (155) and ABD (120)  R GHJ inf grade III  Program Notes No Adduction or Internal rotation/hand behind backNo Active ROM or lifting with the armROM max of 160 when going OH  Exercises - Shoulder Flexion Overhead with Dowel  - 2 x daily - 7 x weekly - 2 sets - 10 reps - 5 hold - Shoulder Scaption AAROM with Dowel  - 2 x daily - 7 x weekly - 2 sets - 10 reps - 5 hold - Standing  Shoulder Abduction AAROM with Dowel  - 2 x daily - 7 x weekly - 2 sets - 10 reps - 5 hold - Standing Shoulder Extension with Dowel  - 1 x daily - 7 x weekly - 2 sets - 10 reps - 5 hold - Doorway Pec Stretch at 60 Degrees Abduction with Arm Straight  - 2 x daily - 7 x weekly - 1 sets - 3 reps - 30 hold - Standing Shoulder Row with Anchored Resistance  - 1 x daily - 7 x weekly - 3 sets - 10 reps - Shoulder External Rotation with Anchored Resistance  - 1 x daily - 7 x weekly - 3 sets - 10 reps - Standing Bicep Curls Supinated with Dumbbells  - 1 x daily - 7 x weekly - 3 sets - 10 reps - Standing Bent Over Triceps Extension  - 1 x daily - 7 x weekly - 3 sets - 10 reps  2/20  PROM to tolerance flexion (148) and ABD (102); alt  R GHJ inf and post grade III  Scar tissue mobilization about the R pec and scar site   2/5  PROM to tolerance flexion (145) and ABD (100) R GHJ inf and post grade III  Table slide flexion and ABD 5s 10x (scooting away with hand passively resting on table)  Isometrics- flexion, ABD, ext, ER 5s 10x each  3lb curls and triceps kickbacks Supine cane ABD 10s 10x  Supine cane ER 5s 15x Seated flexion foam roller 5s 10x Standing flexion/ L stretch for the R shoulder; swissball on table 5s 10x  Table slide ABD arm passive 5s 10x     PATIENT EDUCATION: Education details: anatomy, exercise progression, DOMS expectations, HEP, POC   Person educated: Patient Education method: Explanation, Demonstration, Tactile cues, Verbal cues, and Handouts Education comprehension: verbalized understanding, returned demonstration, verbal cues required, and tactile cues required   HOME EXERCISE PROGRAM:   Access Code: [email or web address] (electronic) URL: [email or web address] Date: 02/28/2022 Prepared by: Daleen Bo      ASSESSMENT:   CLINICAL IMPRESSION:  Pt is now 6 weeks s/p and able to continue with  progressions per protocol. Initiated gentle WB through B UES in  standing position without discomfort. Cable column exercises performed with RUE without c/o pain or discomfort. Pt cued to engage lats and rhomboids with shoulder exention and adduction. Will continue with protocol as tolerated and monitor soreness level.    OBJECTIVE IMPAIRMENTS decreased strength, impaired UE functional use, improper body mechanics, postural dysfunction, pain, and hypermobility .    ACTIVITY LIMITATIONS community activity, occupation, dressing, bathing, self-care, and exercise/recreation .    PERSONAL FACTORS Fitness, 1-2 comorbidities, and Time since onset of injury/illness/exacerbation are also affecting patient's functional outcome.      REHAB POTENTIAL: Good   CLINICAL DECISION MAKING: Stable/uncomplicated   EVALUATION COMPLEXITY: Low   GOALS:     SHORT TERM GOALS: Target Date . 04/11/2022          Pt will become independent with HEP in order to demonstrate synthesis of PT education.     Goal status: INITIAL   2.  Pt will be able to demonstrate PROM of 90 flexion in order to demonstrate functional improvement in UE function for self-care and house hold duties.     Goal status: INITIAL   3.  Pt will be able to demonstrate ability to perform full AROM without discomfort or limitation in order to demonstrate functional improvement in UE function for self-care and house hold duties.    Goal status: INITIAL     LONG TERM GOALS: Target Date 05/23/2022        Pt  will become independent with final HEP in order to demonstrate synthesis of PT education.     Goal status: INITIAL   2.  Pt will be able to demonstrate ability to perform 5lb shoulder flexion, ABD, and scaption in order to demonstrate functional improvement in UE function for self-care and house hold duties.      Goal status: INITIAL   3.  Pt will be able to demonstrate 5x full push ups without pain in order to demonstrate functional improvement in UE function for progression towards  return to exercise and ability to push during normal daily activity.      Goal status: INITIAL   4.  Pt will be able to reach, lift, and carry OH >25lbs  in order to demonstrate functional improvement in R UE strength for transition to return to occupation.     Goal status: INITIAL     PLAN: PT FREQUENCY: 1-2x/week   PT DURATION: 12 weeks   PLANNED INTERVENTIONS: Therapeutic exercises, Therapeutic activity, Neuromuscular re-education, Balance training, Gait training, Patient/Family education, Self Care, Joint mobilization, Joint manipulation, DME instructions, Aquatic Therapy, Dry Needling, Electrical stimulation, Spinal manipulation, Spinal mobilization, Cryotherapy, Moist heat, scar mobilization, Splintting, Taping, Vasopneumatic device, Traction, Ultrasound, Ionotophoresis '4mg'$ /ml Dexamethasone, Manual therapy, and Re-evaluation   PLAN FOR NEXT SESSION: continue per [facility] repair protocol     Graceann Congress Elexia Friedt, PTA 04/08/2022, 5:03 PM

## 2022-04-12 ENCOUNTER — Ambulatory Visit (HOSPITAL_BASED_OUTPATIENT_CLINIC_OR_DEPARTMENT_OTHER): Payer: No Typology Code available for payment source | Attending: Orthopaedic Surgery

## 2022-04-12 ENCOUNTER — Encounter (HOSPITAL_BASED_OUTPATIENT_CLINIC_OR_DEPARTMENT_OTHER): Payer: Self-pay

## 2022-04-12 DIAGNOSIS — M6281 Muscle weakness (generalized): Secondary | ICD-10-CM | POA: Diagnosis present

## 2022-04-12 DIAGNOSIS — M25611 Stiffness of right shoulder, not elsewhere classified: Secondary | ICD-10-CM

## 2022-04-12 DIAGNOSIS — M25511 Pain in right shoulder: Secondary | ICD-10-CM

## 2022-04-12 NOTE — Therapy (Signed)
OUTPATIENT PHYSICAL THERAPY UPPER EXTREMITY TREATMENT   Patient Name: Brandon Lawson MRN: [id number] DOB:1975/09/06, 47 y.o., male Today's Date: 04/12/2022  END OF SESSION:  PT End of Session - 04/12/22 1257     Visit Number 9    Number of Visits 25    Date for PT Re-Evaluation 05/29/22    Authorization Type Worker's Comp    PT Start Time 1303    PT Stop Time 1345    PT Time Calculation (min) 42 min    Activity Tolerance Patient tolerated treatment well    Behavior During Therapy WFL for tasks assessed/performed                    Past Medical History:  Diagnosis Date   Asthma    seasonal only- no inhaler   HLD (hyperlipidemia)    no meds, diet controlled   Hyperlipidemia    Past Surgical History:  Procedure Laterality Date   COLONOSCOPY  2023   PECTORALIS TENDON REPAIR Right 02/24/2022   Procedure: RIGHT PECTORALIS MAJOR REPAIR;  Surgeon: Vanetta Mulders, MD;  Location: [place];  Service: Orthopedics;  Laterality: Right;   Testicular torsion     at age 54 yrs old   Patient Active Problem List   Diagnosis Date Noted   Pectoralis muscle rupture 02/24/2022    PCP:  Wenda Low, MD       REFERRING PROVIDER:  Vanetta Mulders, MD     REFERRING DIAG:  S29.011A (ICD-10-CM) - Rupture of pectoralis major muscle, initial encounter      THERAPY DIAG:  Acute pain of right shoulder  Muscle weakness (generalized)  Decreased right shoulder range of motion  Rationale for Evaluation and Treatment: Rehabilitation  ONSET DATE: 02/24/22 Surgery  Days since surgery: 47   PROCEDURE: 1.  Right pectoralis major repair    SUBJECTIVE:                                                                                                                                                                                      SUBJECTIVE STATEMENT:  Pt reports good response to progressions last visit. Did try some light yard work over the weeks so shoulder is slightly  sore. No pec soreness reported.  Eval: Pt states that pain has been fairly well managed. He has only been taking the aspirin as prescribed. It will ache on occasion but no pain. Moving around will cause it to ache. Sleeps in bed with pillows on an incline. Icing 2x a day. Keeping it on for 30-40 min at a time. Pt denies signs of infection or DVT. Requires heavy lifting, door breaching, climbing ladders,  etc. At work.   PERTINENT HISTORY: N/A  PAIN:  Are you having pain? No: NPRS scale: 0/10   PRECAUTIONS: Shoulder  WEIGHT BEARING RESTRICTIONS: Yes NWB "He will not have an abduction pillow"  FALLS:  Has patient fallen in last 6 months? 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 Complete tear of the distal tendon of the right pectoralis major muscle with medial retraction of approximately 8 cm. 2. Evolving hematoma surrounding the torn tendon stump measuring approximately 5.7 x 4.6 x 4.6 cm.  PATIENT SURVEYS :  FOTO 34 71 @ DC 23 pts MCII UPPER EXTREMITY ROM:    Passive ROM Right eval R 2/5 R  3/5  Shoulder flexion 65 87 160  Shoulder extension N/A  35  Shoulder abduction 60 60 135  Shoulder adduction      Shoulder internal rotation To belly  To belly  Shoulder external rotation N/A To neutral 35  (Blank rows = not tested)    PALPATION:  No TTP noted around incision sights; steri-strips removed               TODAY'S TREATMENT:                                                                                                                                         DATE:   3/11 PROM R shoulder flexion, abd, and extension  - Doorway Pec Stretch at 90 Degrees  Abduction  - 2 x daily - 7 x weekly - 1 sets - 3 reps - 30 hold - Standing Shoulder Internal Rotation Stretch with Towel  - 2 x daily - 7 x weekly - 1 sets - 3 reps - 30 hold  -Prone I,Y,T 2x10eaR -Prone row 10#DB 2x10 -ER with RTB 2x10R -IR with GTB 2x10R -Partial wall push up 2x10 -Quadruped arm raises 1#DB 2x10ea Body blade 3way- 15" x2 ea  -Cable column single arm row 17.5lbs 2x10 -Cable column single arm shoulder ext from overhead 2x10L 12.5lbs -Cable column Triceps ext-22lbs 2x10 -Cable column adduction to neutral 12.5 lbs 2x10  3/8 PROM R shoulder flexion, abd, IR/ER  Row and curls 10-15lbs Shoulder raises 3lbs    - Doorway Pec Stretch at 90 Degrees Abduction  - 2 x daily - 7 x weekly - 1 sets - 3 reps - 30 hold - Standing Shoulder Internal Rotation Stretch with Towel  - 2 x daily - 7 x weekly - 1 sets - 3 reps - 30 hold    -ER with RTB 2x10R -IR with RTB 2x10R  -Partial wall push up 2x10 -Bent over row 3x10  10lbR -Cable column single arm row 17.5lbs -Cable column single arm shoulder ext from overhead 2x10L 12.5lbs -Cable column Triceps ext-17.5lbs 2x10 -Cable column adduction to neutral 7.5lbs 2x10  3/5 R GHJ inf and post grade III-IV STM pec into ABD 90 deg  Row and curls 10-15lbs Shoulder raises 3lbs   Wall slide pec stretch 5s 10x - Doorway Pec Stretch at 90 Degrees Abduction  - 2 x daily - 7 x weekly - 1 sets - 3 reps - 30 hold - Standing Shoulder Internal Rotation Stretch with Towel  - 2 x daily - 7 x weekly - 1 sets - 3 reps - 30 hold - Standing Shoulder Abduction AAROM with Dowel  - 2 x daily - 7 x weekly - 2 sets - 10 reps - 5 hold - Bent Over Single Arm Shoulder Row with Dumbbell  - 1 x daily - 3 x weekly - 3 sets - 10 reps - Standing Bicep Curls Supinated with Dumbbells  - 1 x daily - 3 x weekly - 3 sets - 10 reps - Triceps Extension   - 1 x daily - 3 x weekly - 3 sets - 10 reps - Shoulder Overhead Press in Flexion with Dumbbells  - 1 x daily - 3  x weekly - 3 sets - 10 reps - Standing Shoulder External Rotation with Resistance  - 1 x daily - 3 x weekly - 3 sets - 10 reps - Shoulder Abduction with Dumbbells - Palms Down  - 1 x daily - 3 x weekly - 3 sets - 10 reps  2/27  PROM to tolerance flexion (155) and ABD (120)  R GHJ inf grade III  Program Notes No Adduction or Internal rotation/hand behind backNo Active ROM or lifting with the armROM max of 160 when going OH  Exercises - Shoulder Flexion Overhead with Dowel  - 2 x daily - 7 x weekly - 2 sets - 10 reps - 5 hold - Shoulder Scaption AAROM with Dowel  - 2 x daily - 7 x weekly - 2 sets - 10 reps - 5 hold - Standing Shoulder Abduction AAROM with Dowel  - 2 x daily - 7 x weekly - 2 sets - 10 reps - 5 hold - Standing Shoulder Extension with Dowel  - 1 x daily - 7 x weekly - 2 sets - 10 reps - 5 hold - Doorway Pec Stretch at 60 Degrees Abduction with Arm Straight  - 2 x daily - 7 x weekly - 1 sets - 3 reps - 30 hold - Standing Shoulder Row with Anchored Resistance  - 1 x daily - 7 x weekly - 3 sets - 10 reps - Shoulder External Rotation with Anchored Resistance  - 1 x daily - 7 x weekly - 3 sets - 10 reps - Standing Bicep Curls Supinated with Dumbbells  - 1 x daily - 7 x weekly - 3 sets - 10 reps - Standing Bent Over Triceps Extension  - 1 x daily - 7 x weekly - 3 sets - 10 reps  2/20  PROM to tolerance flexion (148) and ABD (102); alt  R GHJ inf and post grade III  Scar tissue mobilization about the R pec and scar site   2/5  PROM to tolerance flexion (145) and ABD (100) R GHJ inf and post grade III  Table slide flexion and ABD 5s 10x (scooting away with hand passively resting on table)  Isometrics- flexion, ABD, ext, ER 5s 10x  each  3lb curls and triceps kickbacks Supine cane ABD 10s 10x  Supine cane ER 5s 15x Seated flexion foam roller 5s 10x Standing flexion/ L stretch for the R shoulder; swissball on table 5s 10x  Table slide ABD arm passive 5s 10x      PATIENT EDUCATION: Education details: anatomy, exercise progression, DOMS expectations, HEP, POC   Person educated: Patient Education method: Explanation, Demonstration, Tactile cues, Verbal cues, and Handouts Education comprehension: verbalized understanding, returned demonstration, verbal cues required, and tactile cues required   HOME EXERCISE PROGRAM:   Access Code: [email or web address] (electronic) URL: [email or web address] Date: 02/28/2022 Prepared by: Daleen Bo      ASSESSMENT:   CLINICAL IMPRESSION:  Pt had excellent tolerance to progressions today. He does have reduced [place] and strength with prone scapular strengthening as well as with body blade performance. He denied any pain with exercises, but does note general muscular fatigue. Will continue to monitor soreness level and progress as tolerated with protocol.     OBJECTIVE IMPAIRMENTS decreased strength, impaired UE functional use, improper body mechanics, postural dysfunction, pain, and hypermobility .    ACTIVITY LIMITATIONS community activity, occupation, dressing, bathing, self-care, and exercise/recreation .    PERSONAL FACTORS Fitness, 1-2 comorbidities, and Time since onset of injury/illness/exacerbation are also affecting patient's functional outcome.      REHAB POTENTIAL: Good   CLINICAL DECISION MAKING: Stable/uncomplicated   EVALUATION COMPLEXITY: Low   GOALS:     SHORT TERM GOALS: Target Date . 04/11/2022          Pt will become independent with HEP in order to demonstrate synthesis of PT education.     Goal status: INITIAL   2.  Pt will be able to demonstrate PROM of 90 flexion in order to demonstrate functional improvement in UE function for self-care and house hold duties.     Goal status: INITIAL   3.  Pt will be able to demonstrate ability to perform full AROM without discomfort or limitation in order to demonstrate functional improvement in UE function for self-care and house  hold duties.    Goal status: INITIAL     LONG TERM GOALS: Target Date 05/23/2022        Pt  will become independent with final HEP in order to demonstrate synthesis of PT education.     Goal status: INITIAL   2.  Pt will be able to demonstrate ability to perform 5lb shoulder flexion, ABD, and scaption in order to demonstrate functional improvement in UE function for self-care and house hold duties.      Goal status: INITIAL   3.  Pt will be able to demonstrate 5x full push ups without pain in order to demonstrate functional improvement in UE function for progression towards return to exercise and ability to push during normal daily activity.      Goal status: INITIAL   4.  Pt will be able to reach, lift, and carry OH >25lbs  in order to demonstrate functional improvement in R UE strength for transition to return to occupation.     Goal status: INITIAL     PLAN: PT FREQUENCY: 1-2x/week   PT DURATION: 12 weeks   PLANNED INTERVENTIONS: Therapeutic exercises, Therapeutic activity, Neuromuscular re-education, Balance training, Gait training, Patient/Family education, Self Care, Joint mobilization, Joint manipulation, DME instructions, Aquatic Therapy, Dry Needling, Electrical stimulation, Spinal manipulation, Spinal mobilization, Cryotherapy, Moist heat, scar mobilization, Splintting, Taping, Vasopneumatic device, Traction, Ultrasound, Ionotophoresis '4mg'$ /ml Dexamethasone, Manual therapy, and Re-evaluation   PLAN  FOR NEXT SESSION: continue per [facility] repair protocol     Deniece Ree, PTA 04/12/2022, 5:09 PM

## 2022-04-15 ENCOUNTER — Ambulatory Visit (HOSPITAL_BASED_OUTPATIENT_CLINIC_OR_DEPARTMENT_OTHER): Payer: No Typology Code available for payment source

## 2022-04-15 ENCOUNTER — Encounter (HOSPITAL_BASED_OUTPATIENT_CLINIC_OR_DEPARTMENT_OTHER): Payer: Self-pay

## 2022-04-15 DIAGNOSIS — M6281 Muscle weakness (generalized): Secondary | ICD-10-CM

## 2022-04-15 DIAGNOSIS — M25611 Stiffness of right shoulder, not elsewhere classified: Secondary | ICD-10-CM

## 2022-04-15 DIAGNOSIS — M25511 Pain in right shoulder: Secondary | ICD-10-CM | POA: Diagnosis not present

## 2022-04-15 NOTE — Therapy (Signed)
OUTPATIENT PHYSICAL THERAPY UPPER EXTREMITY TREATMENT   Patient Name: Brandon Lawson MRN: [id number] DOB:Sep 15, 1975, 47 y.o., male Today's Date: 04/15/2022  END OF SESSION:  PT End of Session - 04/15/22 0854     Visit Number 10    Number of Visits 25    Date for PT Re-Evaluation 05/29/22    Authorization Type Worker's Comp    PT Start Time [phone]    PT Stop Time 0927    PT Time Calculation (min) 36 min    Activity Tolerance Patient tolerated treatment well    Behavior During Therapy WFL for tasks assessed/performed                    Past Medical History:  Diagnosis Date   Asthma    seasonal only- no inhaler   HLD (hyperlipidemia)    no meds, diet controlled   Hyperlipidemia    Past Surgical History:  Procedure Laterality Date   COLONOSCOPY  2023   PECTORALIS TENDON REPAIR Right 02/24/2022   Procedure: RIGHT PECTORALIS MAJOR REPAIR;  Surgeon: Vanetta Mulders, MD;  Location: [place];  Service: Orthopedics;  Laterality: Right;   Testicular torsion     at age 25 yrs old   Patient Active Problem List   Diagnosis Date Noted   Pectoralis muscle rupture 02/24/2022    PCP:  Wenda Low, MD       REFERRING PROVIDER:  Vanetta Mulders, MD     REFERRING DIAG:  S29.011A (ICD-10-CM) - Rupture of pectoralis major muscle, initial encounter      THERAPY DIAG:  Acute pain of right shoulder  Decreased right shoulder range of motion  Muscle weakness (generalized)  Rationale for Evaluation and Treatment: Rehabilitation  ONSET DATE: 02/24/22 Surgery  Days since surgery: 50   PROCEDURE: 1.  Right pectoralis major repair    SUBJECTIVE:                                                                                                                                                                                      SUBJECTIVE STATEMENT:  Pt repotrs he worked out prior to [employer]. Has difficulty with abduction movements reporting popping.   Eval: Pt states that pain has been fairly well managed. He has only been taking the aspirin as prescribed. It will ache on occasion but no pain. Moving around will cause it to ache. Sleeps in bed with pillows on an incline. Icing 2x a day. Keeping it on for 30-40 min at a time. Pt denies signs of infection or DVT. Requires heavy lifting, door breaching, climbing ladders, etc. At work.   PERTINENT HISTORY: N/A  PAIN:  Are you having pain? No: NPRS scale: 0/10   PRECAUTIONS: Shoulder  WEIGHT BEARING RESTRICTIONS: Yes NWB "He will not have an abduction pillow"  FALLS:  Has patient fallen in last 6 months? 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 Complete tear of the distal tendon of the right pectoralis major muscle with medial retraction of approximately 8 cm. 2. Evolving hematoma surrounding the torn tendon stump measuring approximately 5.7 x 4.6 x 4.6 cm.  PATIENT SURVEYS :  FOTO 34 71 @ DC 23 pts MCII UPPER EXTREMITY ROM:    Passive ROM Right eval R 2/5 R  3/5  Shoulder flexion 65 87 160  Shoulder extension N/A  35  Shoulder abduction 60 60 135  Shoulder adduction      Shoulder internal rotation To belly  To belly  Shoulder external rotation N/A To neutral 35  (Blank rows = not tested)    PALPATION:  No TTP noted around incision sights; steri-strips removed               TODAY'S TREATMENT:                                                                                                                                         DATE:    3/14 PROM R shoulder  GHJ mobilizations ant,post, and inf glides grade II-III Prone flexion 2x10 Prone extension  2x10 Supine press up- 5lbs 2x10 S/L ER 4# 2x10 S/L abduction 2# 2x10 to 90deg Partial wall push ups 2x10 -Quadruped arm raises 1#DB 2x10ea  3/11 PROM R shoulder flexion, abd, and extension  - Doorway Pec Stretch at 90 Degrees Abduction  - 2 x daily - 7 x weekly - 1 sets - 3 reps - 30 hold - Standing Shoulder Internal Rotation Stretch with Towel  - 2 x daily - 7 x weekly - 1 sets - 3 reps - 30 hold  -Prone I,Y,T 2x10eaR -Prone row 10#DB 2x10 -ER with RTB 2x10R -IR with GTB 2x10R -Partial wall push up 2x10 -Quadruped arm raises 1#DB 2x10ea Body blade 3way- 15" x2 ea  -Cable column single arm row 17.5lbs 2x10 -Cable column single arm shoulder ext from overhead 2x10L 12.5lbs -Cable column Triceps ext-22lbs 2x10 -Cable column adduction to neutral 12.5 lbs 2x10  3/8 PROM R shoulder flexion, abd, IR/ER  Row and curls 10-15lbs Shoulder raises 3lbs    - Doorway Pec Stretch at 90 Degrees Abduction  - 2 x daily - 7 x weekly - 1 sets - 3 reps - 30 hold - Standing Shoulder Internal Rotation Stretch with Towel  - 2 x  daily - 7 x weekly - 1 sets - 3 reps - 30 hold    -ER with RTB 2x10R -IR with RTB 2x10R  -Partial wall push up 2x10 -Bent over row 3x10 10lbR -Cable column single arm row 17.5lbs -Cable column single arm shoulder ext from overhead 2x10L 12.5lbs -Cable column Triceps ext-17.5lbs 2x10 -Cable column adduction to neutral 7.5lbs 2x10  3/5 R GHJ inf and post grade III-IV STM pec into ABD 90 deg  Row and curls 10-15lbs Shoulder raises 3lbs   Wall slide pec stretch 5s 10x - Doorway Pec Stretch at 90 Degrees Abduction  - 2 x daily - 7 x weekly - 1 sets - 3 reps - 30 hold - Standing Shoulder Internal Rotation Stretch with Towel  - 2 x daily - 7 x weekly - 1 sets - 3 reps - 30 hold - Standing Shoulder Abduction AAROM with Dowel  - 2 x daily - 7 x weekly - 2 sets - 10 reps - 5 hold - Bent Over Single Arm Shoulder Row with Dumbbell  - 1 x daily - 3 x weekly - 3 sets - 10  reps - Standing Bicep Curls Supinated with Dumbbells  - 1 x daily - 3 x weekly - 3 sets - 10 reps - Triceps Extension   - 1 x daily - 3 x weekly - 3 sets - 10 reps - Shoulder Overhead Press in Flexion with Dumbbells  - 1 x daily - 3 x weekly - 3 sets - 10 reps - Standing Shoulder External Rotation with Resistance  - 1 x daily - 3 x weekly - 3 sets - 10 reps - Shoulder Abduction with Dumbbells - Palms Down  - 1 x daily - 3 x weekly - 3 sets - 10 reps       PATIENT EDUCATION: Education details: anatomy, exercise progression, DOMS expectations, HEP, POC   Person educated: Patient Education method: Explanation, Demonstration, Tactile cues, Verbal cues, and Handouts Education comprehension: verbalized understanding, returned demonstration, verbal cues required, and tactile cues required   HOME EXERCISE PROGRAM:   Access Code: [email or web address] (electronic) URL: [email or web address] Date: 02/28/2022 Prepared by: Daleen Bo      ASSESSMENT:   CLINICAL IMPRESSION:  Spent increased time on PROM and GHJ mobilizations to address c/o joint tightness. He did report improved mobility following mobilizations. Remains restricted into end range shoulder flexion, so will monitor this. Pt able to initial partial WB with wall push ups today without c/o pain. Will continue to progress as tolerated with protocol.    OBJECTIVE IMPAIRMENTS decreased strength, impaired UE functional use, improper body mechanics, postural dysfunction, pain, and hypermobility .    ACTIVITY LIMITATIONS community activity, occupation, dressing, bathing, self-care, and exercise/recreation .    PERSONAL FACTORS Fitness, 1-2 comorbidities, and Time since onset of injury/illness/exacerbation are also affecting patient's functional outcome.      REHAB POTENTIAL: Good   CLINICAL DECISION MAKING: Stable/uncomplicated   EVALUATION COMPLEXITY: Low   GOALS:     SHORT TERM GOALS: Target Date . 04/11/2022           Pt will become independent with HEP in order to demonstrate synthesis of PT education.     Goal status: INITIAL   2.  Pt will be able to demonstrate PROM of 90 flexion in order to demonstrate functional improvement in UE function for self-care and house hold duties.     Goal status: INITIAL   3.  Pt will be able to demonstrate ability to  perform full AROM without discomfort or limitation in order to demonstrate functional improvement in UE function for self-care and house hold duties.    Goal status: INITIAL     LONG TERM GOALS: Target Date 05/23/2022        Pt  will become independent with final HEP in order to demonstrate synthesis of PT education.     Goal status: INITIAL   2.  Pt will be able to demonstrate ability to perform 5lb shoulder flexion, ABD, and scaption in order to demonstrate functional improvement in UE function for self-care and house hold duties.      Goal status: INITIAL   3.  Pt will be able to demonstrate 5x full push ups without pain in order to demonstrate functional improvement in UE function for progression towards return to exercise and ability to push during normal daily activity.      Goal status: INITIAL   4.  Pt will be able to reach, lift, and carry OH >25lbs  in order to demonstrate functional improvement in R UE strength for transition to return to occupation.     Goal status: INITIAL     PLAN: PT FREQUENCY: 1-2x/week   PT DURATION: 12 weeks   PLANNED INTERVENTIONS: Therapeutic exercises, Therapeutic activity, Neuromuscular re-education, Balance training, Gait training, Patient/Family education, Self Care, Joint mobilization, Joint manipulation, DME instructions, Aquatic Therapy, Dry Needling, Electrical stimulation, Spinal manipulation, Spinal mobilization, Cryotherapy, Moist heat, scar mobilization, Splintting, Taping, Vasopneumatic device, Traction, Ultrasound, Ionotophoresis 4mg /ml Dexamethasone, Manual therapy, and Re-evaluation    PLAN FOR NEXT SESSION: continue per [facility] repair protocol     Graceann Congress Petronella Shuford, PTA 04/15/2022, 11:42 AM

## 2022-04-19 ENCOUNTER — Ambulatory Visit (HOSPITAL_BASED_OUTPATIENT_CLINIC_OR_DEPARTMENT_OTHER): Payer: No Typology Code available for payment source | Admitting: Physical Therapy

## 2022-04-19 ENCOUNTER — Encounter (HOSPITAL_BASED_OUTPATIENT_CLINIC_OR_DEPARTMENT_OTHER): Payer: Self-pay | Admitting: Physical Therapy

## 2022-04-19 DIAGNOSIS — M25511 Pain in right shoulder: Secondary | ICD-10-CM

## 2022-04-19 DIAGNOSIS — M25611 Stiffness of right shoulder, not elsewhere classified: Secondary | ICD-10-CM

## 2022-04-19 DIAGNOSIS — M6281 Muscle weakness (generalized): Secondary | ICD-10-CM

## 2022-04-19 NOTE — Therapy (Signed)
OUTPATIENT PHYSICAL THERAPY UPPER EXTREMITY TREATMENT   Patient Name: Brandon Lawson MRN: [id number] DOB:04/19/1975, 47 y.o., male Today's Date: 04/19/2022  END OF SESSION:  PT End of Session - 04/19/22 1317     Visit Number 11    Number of Visits 25    Date for PT Re-Evaluation 05/29/22    Authorization Type Worker's Comp    PT Start Time 1315    PT Stop Time 1400    PT Time Calculation (min) 45 min    Activity Tolerance Patient tolerated treatment well    Behavior During Therapy WFL for tasks assessed/performed                    Past Medical History:  Diagnosis Date   Asthma    seasonal only- no inhaler   HLD (hyperlipidemia)    no meds, diet controlled   Hyperlipidemia    Past Surgical History:  Procedure Laterality Date   COLONOSCOPY  2023   PECTORALIS TENDON REPAIR Right 02/24/2022   Procedure: RIGHT PECTORALIS MAJOR REPAIR;  Surgeon: Vanetta Mulders, MD;  Location: [place];  Service: Orthopedics;  Laterality: Right;   Testicular torsion     at age 35 yrs old   Patient Active Problem List   Diagnosis Date Noted   Pectoralis muscle rupture 02/24/2022    PCP:  Wenda Low, MD       REFERRING PROVIDER:  Vanetta Mulders, MD     REFERRING DIAG:  S29.011A (ICD-10-CM) - Rupture of pectoralis major muscle, initial encounter      THERAPY DIAG:  Acute pain of right shoulder  Decreased right shoulder range of motion  Muscle weakness (generalized)  Rationale for Evaluation and Treatment: Rehabilitation  ONSET DATE: 02/24/22 Surgery  Days since surgery: 54   PROCEDURE: 1.  Right pectoralis major repair    SUBJECTIVE:                                                                                                                                                                                      SUBJECTIVE STATEMENT:  Pt states the shoulder still pops but there is some discomfort into the back of the shoulder.   Eval: Pt states that  pain has been fairly well managed. He has only been taking the aspirin as prescribed. It will ache on occasion but no pain. Moving around will cause it to ache. Sleeps in bed with pillows on an incline. Icing 2x a day. Keeping it on for 30-40 min at a time. Pt denies signs of infection or DVT. Requires heavy lifting, door breaching, climbing ladders, etc. At work.   PERTINENT HISTORY: N/A  PAIN:  Are you having pain? No: NPRS scale: 0/10   PRECAUTIONS: Shoulder  WEIGHT BEARING RESTRICTIONS: Yes NWB "He will not have an abduction pillow"  FALLS:  Has patient fallen in last 6 months? 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 Complete tear of the distal tendon of the right pectoralis major muscle with medial retraction of approximately 8 cm. 2. Evolving hematoma surrounding the torn tendon stump measuring approximately 5.7 x 4.6 x 4.6 cm.  PATIENT SURVEYS :  FOTO 34 71 @ DC 23 pts MCII  FOTO 3/19 63  UPPER EXTREMITY ROM:    Passive ROM Right eval R 2/5 R  3/5 R  AROM 3/19  Shoulder flexion 65 87 160 150  Shoulder extension N/A  35 40  Shoulder abduction 60 60 135 145  Shoulder adduction       Shoulder internal rotation To belly  To belly L4 p!  Shoulder external rotation N/A To neutral 35 55  (Blank rows = not tested)    MMT: full break test not performed but pt able to perform at least 4-/5 resistance today during session              TODAY'S TREATMENT:                                                                                                                                         DATE:   3/19  GHJ mobilizations ant,post, and inf glides grade  III-IV STM to R infra, middle delt   Self STM with tennis ball Wall push up 2x10 Landmine press- bar only 2x10 DB press 5 lbs    3/14 PROM R shoulder  GHJ mobilizations ant,post, and inf glides grade II-III Prone flexion 2x10 Prone extension 2x10 Supine press up- 5lbs 2x10 S/L ER 4# 2x10 S/L abduction 2# 2x10 to 90deg Partial wall push ups 2x10 -Quadruped arm raises 1#DB 2x10ea  3/11 PROM R shoulder flexion, abd, and extension  - Doorway Pec Stretch at 90 Degrees Abduction  - 2 x daily - 7 x weekly - 1 sets - 3 reps - 30 hold - Standing Shoulder Internal Rotation Stretch with Towel  - 2 x daily - 7 x weekly - 1 sets - 3 reps - 30 hold  -Prone I,Y,T 2x10eaR -Prone row 10#DB 2x10 -ER with RTB 2x10R -IR with GTB 2x10R -Partial wall push up 2x10 -Quadruped arm raises 1#DB 2x10ea Body blade 3way- 15" x2 ea  -Cable column single arm row 17.5lbs 2x10 -Cable column single arm shoulder ext from overhead 2x10L 12.5lbs -Cable column Triceps ext-22lbs 2x10 -Cable column  adduction to neutral 12.5 lbs 2x10  3/8 PROM R shoulder flexion, abd, IR/ER  Row and curls 10-15lbs Shoulder raises 3lbs    - Doorway Pec Stretch at 90 Degrees Abduction  - 2 x daily - 7 x weekly - 1 sets - 3 reps - 30 hold - Standing Shoulder Internal Rotation Stretch with Towel  - 2 x daily - 7 x weekly - 1 sets - 3 reps - 30 hold    -ER with RTB 2x10R -IR with RTB 2x10R  -Partial wall push up 2x10 -Bent over row 3x10 10lbR -Cable column single arm row 17.5lbs -Cable column single arm shoulder ext from overhead 2x10L 12.5lbs -Cable column Triceps ext-17.5lbs 2x10 -Cable column adduction to neutral 7.5lbs 2x10  3/5 R GHJ inf and post grade III-IV STM pec into ABD 90 deg  Row and curls 10-15lbs Shoulder raises 3lbs   Wall slide pec stretch 5s 10x - Doorway Pec Stretch at 90 Degrees Abduction  - 2 x daily - 7 x weekly - 1 sets - 3 reps - 30 hold - Standing Shoulder Internal Rotation Stretch  with Towel  - 2 x daily - 7 x weekly - 1 sets - 3 reps - 30 hold - Standing Shoulder Abduction AAROM with Dowel  - 2 x daily - 7 x weekly - 2 sets - 10 reps - 5 hold - Bent Over Single Arm Shoulder Row with Dumbbell  - 1 x daily - 3 x weekly - 3 sets - 10 reps - Standing Bicep Curls Supinated with Dumbbells  - 1 x daily - 3 x weekly - 3 sets - 10 reps - Triceps Extension   - 1 x daily - 3 x weekly - 3 sets - 10 reps - Shoulder Overhead Press in Flexion with Dumbbells  - 1 x daily - 3 x weekly - 3 sets - 10 reps - Standing Shoulder External Rotation with Resistance  - 1 x daily - 3 x weekly - 3 sets - 10 reps - Shoulder Abduction with Dumbbells - Palms Down  - 1 x daily - 3 x weekly - 3 sets - 10 reps       PATIENT EDUCATION: Education details: anatomy, exercise progression, DOMS expectations, HEP, POC   Person educated: Patient Education method: Explanation, Demonstration, Tactile cues, Verbal cues, and Handouts Education comprehension: verbalized understanding, returned demonstration, verbal cues required, and tactile cues required   HOME EXERCISE PROGRAM:   Access Code: [email or web address] (electronic) URL: [email or web address] Date: 02/28/2022 Prepared by: Daleen Bo      ASSESSMENT:   CLINICAL IMPRESSION:  Pt with soft tissue restriction likely causing pain into the shoulder. Pt able to diminish pain with self STM during session so advised to continue with recovery post and pre workout as needed. Pt HEP updated today to increase resistance training for the R UE with shoulder OHP and abduction placed on hold to reduce deltoid irritation. Pt has nearly met FOTO goal as well at today's session. Plan to continue per protocol as pt is making steady and continuous progress. Add supine DB press to HEP at next Pt would benefit from continued skilled therapy in order to reach goals and maximize functional R UE strength and ROM for full return to PLOF.     OBJECTIVE IMPAIRMENTS  decreased strength, impaired UE functional use, improper body mechanics, postural dysfunction, pain, and hypermobility .    ACTIVITY LIMITATIONS community activity, occupation, dressing, bathing, self-care, and exercise/recreation .    PERSONAL FACTORS Fitness,  1-2 comorbidities, and Time since onset of injury/illness/exacerbation are also affecting patient's functional outcome.      REHAB POTENTIAL: Good   CLINICAL DECISION MAKING: Stable/uncomplicated   EVALUATION COMPLEXITY: Low   GOALS:     SHORT TERM GOALS: Target Date . 04/11/2022          Pt will become independent with HEP in order to demonstrate synthesis of PT education.     Goal status: INITIAL   2.  Pt will be able to demonstrate PROM of 90 flexion in order to demonstrate functional improvement in UE function for self-care and house hold duties.     Goal status: INITIAL   3.  Pt will be able to demonstrate ability to perform full AROM without discomfort or limitation in order to demonstrate functional improvement in UE function for self-care and house hold duties.    Goal status: INITIAL     LONG TERM GOALS: Target Date 05/23/2022        Pt  will become independent with final HEP in order to demonstrate synthesis of PT education.     Goal status: INITIAL   2.  Pt will be able to demonstrate ability to perform 5lb shoulder flexion, ABD, and scaption in order to demonstrate functional improvement in UE function for self-care and house hold duties.      Goal status: INITIAL   3.  Pt will be able to demonstrate 5x full push ups without pain in order to demonstrate functional improvement in UE function for progression towards return to exercise and ability to push during normal daily activity.      Goal status: INITIAL   4.  Pt will be able to reach, lift, and carry OH >25lbs  in order to demonstrate functional improvement in R UE strength for transition to return to occupation.     Goal status:  INITIAL     PLAN: PT FREQUENCY: 1-2x/week   PT DURATION: 12 weeks   PLANNED INTERVENTIONS: Therapeutic exercises, Therapeutic activity, Neuromuscular re-education, Balance training, Gait training, Patient/Family education, Self Care, Joint mobilization, Joint manipulation, DME instructions, Aquatic Therapy, Dry Needling, Electrical stimulation, Spinal manipulation, Spinal mobilization, Cryotherapy, Moist heat, scar mobilization, Splintting, Taping, Vasopneumatic device, Traction, Ultrasound, Ionotophoresis 4mg /ml Dexamethasone, Manual therapy, and Re-evaluation   PLAN FOR NEXT SESSION: continue per [facility] repair protocol     Daleen Bo, PT 04/19/2022, 2:03 PM

## 2022-04-22 ENCOUNTER — Ambulatory Visit (INDEPENDENT_AMBULATORY_CARE_PROVIDER_SITE_OTHER): Payer: 59

## 2022-04-22 ENCOUNTER — Ambulatory Visit (HOSPITAL_BASED_OUTPATIENT_CLINIC_OR_DEPARTMENT_OTHER): Payer: No Typology Code available for payment source

## 2022-04-22 ENCOUNTER — Ambulatory Visit (HOSPITAL_BASED_OUTPATIENT_CLINIC_OR_DEPARTMENT_OTHER): Payer: 59 | Admitting: Orthopaedic Surgery

## 2022-04-22 DIAGNOSIS — G8929 Other chronic pain: Secondary | ICD-10-CM

## 2022-04-22 DIAGNOSIS — M25562 Pain in left knee: Secondary | ICD-10-CM

## 2022-04-22 DIAGNOSIS — M7652 Patellar tendinitis, left knee: Secondary | ICD-10-CM

## 2022-04-22 NOTE — Progress Notes (Signed)
Chief Complaint: Left knee pain     History of Present Illness:    Brandon Lawson is a 47 y.o. male presents today with a longstanding chronic history of left knee pain.  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  He has previously had physical therapy at a young age with stretching of the patella tendon without any relief.  At today's visit he is having persistent pain and inability to kneel down as he is tender predominantly over the tibial tubercle.  He does enjoy running and is very active although he has not been able to run as a result of this as well as a medial based joint pain.  At this time this is restricting his ability to be active and as result he is hoping to get this assessed.    Surgical History:   None with regard to left knee  PMH/PSH/Family History/Social History/Meds/Allergies:    Past Medical History:  Diagnosis Date   Asthma    seasonal only- no inhaler   HLD (hyperlipidemia)    no meds, diet controlled   Hyperlipidemia    Past Surgical History:  Procedure Laterality Date   COLONOSCOPY  2023   PECTORALIS TENDON REPAIR Right 02/24/2022   Procedure: RIGHT PECTORALIS MAJOR REPAIR;  Surgeon: Vanetta Mulders, MD;  Location: [facility];  Service: Orthopedics;  Laterality: Right;   Testicular torsion     at age 58 yrs old   Social History   Socioeconomic History   Marital status: Married    Spouse name: Not on file   Number of children: 3   Years of education: Not on file   Highest education level: Bachelor's degree (e.g., BA, AB, BS)  Occupational History   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[employer]    Employer: [employer]  Tobacco Use   Smoking status: Former   Smokeless tobacco: Current    Types: Chew   Tobacco comments:    every now and then  while in college only  Vaping Use   Vaping Use: Never used  Substance and Sexual Activity    Alcohol use: Yes    Alcohol/week: 3.0 - 5.0 standard drinks of alcohol    Types: 3 - 5 Glasses of wine per week    Comment: Socially    Drug use: No   Sexual activity: Yes  Other Topics Concern   Not on file  Social History Narrative   Not on file   Social Determinants of Health   Financial Resource Strain: Not on file  Food Insecurity: Not on file  Transportation Needs: Not on file  Physical Activity: Sufficiently Active (04/14/2017)   Exercise Vital Sign    Days of Exercise per Week: 5 days    Minutes of Exercise per Session: 30 min  Stress: Not on file  Social Connections: Not on file   Family History  Problem Relation Age of Onset   Hypertension Mother    Hyperlipidemia Mother    Heart attack Father    Heart disease Father    Coronary artery disease Father    Heart attack Maternal Grandmother    No Known Allergies Current Outpatient Medications  Medication Sig Dispense Refill   aspirin EC 325 MG tablet  Take 1 tablet (325 mg total) by mouth daily. (Patient taking differently: Take 325 mg by mouth daily. For after surgery) 30 tablet 0   fluticasone (FLONASE) 50 MCG/ACT nasal spray Place 1 spray into both nostrils daily.     naphazoline-glycerin (CLEAR EYES REDNESS) 0.012-0.2 % SOLN Place 1-2 drops into both eyes 4 (four) times daily as needed for eye irritation.     naproxen (NAPROSYN) 500 MG tablet Take 1 tablet (500 mg total) by mouth 2 (two) times daily. (Patient not taking: Reported on 02/21/2022) 30 tablet 0   Omega-3 1000 MG CAPS Take 1,000 mg by mouth daily.     oxyCODONE (OXY IR/ROXICODONE) 5 MG immediate release tablet Take 1 tablet (5 mg total) by mouth every 4 (four) hours as needed (severe pain). 20 tablet 0   No current facility-administered medications for this visit.   No results found.  Review of Systems:   A ROS was performed including pertinent positives and negatives as documented in the HPI.  Physical Exam :   Constitutional: NAD and appears  stated age Neurological: Alert and oriented Psych: Appropriate affect and cooperative There were no vitals taken for this visit.   Comprehensive Musculoskeletal Exam:    There is tenderness about the tibial tubercle as well as the medial joint space.  Positive McMurray medially.  Negative Lachman, negative posterior drawer, no laxity with varus or valgus stress.  Range of motion is from -3 to 135 degrees.  No effusion.  Imaging:   Xray (4 views left knee): Significant previous Osgood Slaughter lesion    I personally reviewed and interpreted the radiographs.   Assessment:   47 y.o. male with left knee pain consistent with left extensor tendinitis at the tibial insertion where his Osgood Slaughter lesion has been chronically painful.  I did describe that overall he has trialed therapy in the past for this which did not give him relief.  I would not recommend injections at this time as his I would not want to cause any type of extensor mechanism rupture.  I did discuss that ultimately given the fact that he is experiencing medial based joint pain I would like to obtain an MRI to look at the patella tendon to see if there is any type of meniscal issue as well as to see how much involvement there is in the patella tendon and tearing to see if this might ultimately need to be addressed.  We will plan for an MRI of the left knee and follow-up to discuss results  Plan :    -Plan for MRI left knee and follow-up to discuss results     I personally saw and evaluated the patient, and participated in the management and treatment plan.  Vanetta Mulders, MD Attending Physician, Orthopedic Surgery  This document was dictated using Dragon voice recognition software. A reasonable attempt at proof reading has been made to minimize errors.

## 2022-04-26 ENCOUNTER — Ambulatory Visit (HOSPITAL_BASED_OUTPATIENT_CLINIC_OR_DEPARTMENT_OTHER): Payer: No Typology Code available for payment source | Attending: Orthopaedic Surgery

## 2022-04-26 ENCOUNTER — Encounter (HOSPITAL_BASED_OUTPATIENT_CLINIC_OR_DEPARTMENT_OTHER): Payer: Self-pay

## 2022-04-26 DIAGNOSIS — Y939 Activity, unspecified: Secondary | ICD-10-CM | POA: Diagnosis not present

## 2022-04-26 DIAGNOSIS — Y929 Unspecified place or not applicable: Secondary | ICD-10-CM | POA: Insufficient documentation

## 2022-04-26 DIAGNOSIS — M6281 Muscle weakness (generalized): Secondary | ICD-10-CM | POA: Insufficient documentation

## 2022-04-26 DIAGNOSIS — M25611 Stiffness of right shoulder, not elsewhere classified: Secondary | ICD-10-CM | POA: Insufficient documentation

## 2022-04-26 DIAGNOSIS — M25511 Pain in right shoulder: Secondary | ICD-10-CM | POA: Insufficient documentation

## 2022-04-26 DIAGNOSIS — S29011A Strain of muscle and tendon of front wall of thorax, initial encounter: Secondary | ICD-10-CM | POA: Insufficient documentation

## 2022-04-26 NOTE — Therapy (Signed)
OUTPATIENT PHYSICAL THERAPY UPPER EXTREMITY TREATMENT   Patient Name: Brandon Lawson MRN: [id number] DOB:07-Nov-1975, 47 y.o., male Today's Date: 04/26/2022  END OF SESSION:  PT End of Session - 04/26/22 1445     Visit Number 12    Number of Visits 25    Date for PT Re-Evaluation 05/29/22    Authorization Type Worker's Comp    PT Start Time 1350    PT Stop Time 1430    PT Time Calculation (min) 40 min    Activity Tolerance Patient tolerated treatment well    Behavior During Therapy WFL for tasks assessed/performed                     Past Medical History:  Diagnosis Date   Asthma    seasonal only- no inhaler   HLD (hyperlipidemia)    no meds, diet controlled   Hyperlipidemia    Past Surgical History:  Procedure Laterality Date   COLONOSCOPY  2023   PECTORALIS TENDON REPAIR Right 02/24/2022   Procedure: RIGHT PECTORALIS MAJOR REPAIR;  Surgeon: Vanetta Mulders, MD;  Location: [facility];  Service: Orthopedics;  Laterality: Right;   Testicular torsion     at age 50 yrs old   Patient Active Problem List   Diagnosis Date Noted   Pectoralis muscle rupture 02/24/2022    PCP:  Wenda Low, MD       REFERRING PROVIDER:  Vanetta Mulders, MD     REFERRING DIAG:  S29.011A (ICD-10-CM) - Rupture of pectoralis major muscle, initial encounter      THERAPY DIAG:  Acute pain of right shoulder  Muscle weakness (generalized)  Decreased right shoulder range of motion  Rationale for Evaluation and Treatment: Rehabilitation  ONSET DATE: 02/24/22 Surgery  Days since surgery: 61   PROCEDURE: 1.  Right pectoralis major repair    SUBJECTIVE:                                                                                                                                                                                      SUBJECTIVE STATEMENT:  Pt reports benefit from self STM using tennis ball at home. Mild pain in shoulder after helping a family member move.  "I probably shouldn't have done that."  Eval: Pt states that pain has been fairly well managed. He has only been taking the aspirin as prescribed. It will ache on occasion but no pain. Moving around will cause it to ache. Sleeps in bed with pillows on an incline. Icing 2x a day. Keeping it on for 30-40 min at a time. Pt denies signs of infection or DVT. Requires heavy lifting, door breaching,  climbing ladders, etc. At work.   PERTINENT HISTORY: N/A  PAIN:  Are you having pain? No: NPRS scale: 0/10   PRECAUTIONS: Shoulder  WEIGHT BEARING RESTRICTIONS: Yes NWB "He will not have an abduction pillow"  FALLS:  Has patient fallen in last 6 months? 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 Complete tear of the distal tendon of the right pectoralis major muscle with medial retraction of approximately 8 cm. 2. Evolving hematoma surrounding the torn tendon stump measuring approximately 5.7 x 4.6 x 4.6 cm.  PATIENT SURVEYS :  FOTO 34 71 @ DC 23 pts MCII  FOTO 3/19 63  UPPER EXTREMITY ROM:    Passive ROM Right eval R 2/5 R  3/5 R  AROM 3/19  Shoulder flexion 65 87 160 150  Shoulder extension N/A  35 40  Shoulder abduction 60 60 135 145  Shoulder adduction       Shoulder internal rotation To belly  To belly L4 p!  Shoulder external rotation N/A To neutral 35 55  (Blank rows = not tested)    MMT: full break test not performed but pt able to perform at least 4-/5 resistance today during session              TODAY'S TREATMENT:                                                                                                                                         DATE:    3/26  GHJ mobilizations post, and inf glides grade III STM to R infra, middle delt, pec   Supine DB press- 7lbs 2x10 Counter push up 2x10 Standing flexion 3# 2x10 Standing abduction 3# 2x10 KB carry 13# 1 lap on track, single break Reverse wall push ups 2x10 OH press 5# 1x10 Wall clock 3 pt RTB x10ea   3/19  GHJ mobilizations ant,post, and inf glides grade III-IV STM to R infra, middle delt   Self STM with tennis ball Wall push up 2x10 Landmine press- bar only 2x10 DB press 5 lbs    3/14 PROM R shoulder  GHJ mobilizations ant,post, and inf glides grade II-III Prone flexion 2x10 Prone extension 2x10 Supine press up- 5lbs 2x10 S/L ER 4# 2x10 S/L abduction 2# 2x10 to 90deg Partial wall push ups 2x10 -Quadruped arm raises 1#DB 2x10ea  3/11 PROM R shoulder flexion, abd, and extension  - Doorway Pec Stretch at 90 Degrees Abduction  - 2 x daily - 7 x weekly - 1 sets - 3 reps - 30 hold - Standing Shoulder Internal Rotation Stretch with Towel  -  2 x daily - 7 x weekly - 1 sets - 3 reps - 30 hold  -Prone I,Y,T 2x10eaR -Prone row 10#DB 2x10 -ER with RTB 2x10R -IR with GTB 2x10R -Partial wall push up 2x10 -Quadruped arm raises 1#DB 2x10ea Body blade 3way- 15" x2 ea  -Cable column single arm row 17.5lbs 2x10 -Cable column single arm shoulder ext from overhead 2x10L 12.5lbs -Cable column Triceps ext-22lbs 2x10 -Cable column adduction to neutral 12.5 lbs 2x10  3/8 PROM R shoulder flexion, abd, IR/ER  Row and curls 10-15lbs Shoulder raises 3lbs    - Doorway Pec Stretch at 90 Degrees Abduction  - 2 x daily - 7 x weekly - 1 sets - 3 reps - 30 hold - Standing Shoulder Internal Rotation Stretch with Towel  - 2 x daily - 7 x weekly - 1 sets - 3 reps - 30 hold    -ER with RTB 2x10R -IR with RTB 2x10R  -Partial wall push up 2x10 -Bent over row 3x10 10lbR -Cable column single arm row 17.5lbs -Cable column single arm shoulder ext from overhead 2x10L  12.5lbs -Cable column Triceps ext-17.5lbs 2x10 -Cable column adduction to neutral 7.5lbs 2x10  3/5 R GHJ inf and post grade III-IV STM pec into ABD 90 deg  Row and curls 10-15lbs Shoulder raises 3lbs   Wall slide pec stretch 5s 10x - Doorway Pec Stretch at 90 Degrees Abduction  - 2 x daily - 7 x weekly - 1 sets - 3 reps - 30 hold - Standing Shoulder Internal Rotation Stretch with Towel  - 2 x daily - 7 x weekly - 1 sets - 3 reps - 30 hold - Standing Shoulder Abduction AAROM with Dowel  - 2 x daily - 7 x weekly - 2 sets - 10 reps - 5 hold - Bent Over Single Arm Shoulder Row with Dumbbell  - 1 x daily - 3 x weekly - 3 sets - 10 reps - Standing Bicep Curls Supinated with Dumbbells  - 1 x daily - 3 x weekly - 3 sets - 10 reps - Triceps Extension   - 1 x daily - 3 x weekly - 3 sets - 10 reps - Shoulder Overhead Press in Flexion with Dumbbells  - 1 x daily - 3 x weekly - 3 sets - 10 reps - Standing Shoulder External Rotation with Resistance  - 1 x daily - 3 x weekly - 3 sets - 10 reps - Shoulder Abduction with Dumbbells - Palms Down  - 1 x daily - 3 x weekly - 3 sets - 10 reps       PATIENT EDUCATION: Education details: anatomy, exercise progression, DOMS expectations, HEP, POC   Person educated: Patient Education method: Explanation, Demonstration, Tactile cues, Verbal cues, and Handouts Education comprehension: verbalized understanding, returned demonstration, verbal cues required, and tactile cues required   HOME EXERCISE PROGRAM:   Access Code: [email or web address] (electronic) URL: [email or web address] Date: 02/28/2022 Prepared by: Daleen Bo      ASSESSMENT:   CLINICAL IMPRESSION:  Pt present with significant stiffness into R passive flexion which did improve after GHJ mobilizations, though not to full ROM. Able to progress with strengthening exercises today without c/o pain in pec mm, though he does c/o discomfort over lateral shoulder. Instructed pt to continue with  self STM using tennis ball at home as this was helpful. Will continue to montior shoulder mobility and pain level.      OBJECTIVE IMPAIRMENTS decreased strength, impaired UE functional use, improper body mechanics,  postural dysfunction, pain, and hypermobility .    ACTIVITY LIMITATIONS community activity, occupation, dressing, bathing, self-care, and exercise/recreation .    PERSONAL FACTORS Fitness, 1-2 comorbidities, and Time since onset of injury/illness/exacerbation are also affecting patient's functional outcome.      REHAB POTENTIAL: Good   CLINICAL DECISION MAKING: Stable/uncomplicated   EVALUATION COMPLEXITY: Low   GOALS:     SHORT TERM GOALS: Target Date . 04/11/2022          Pt will become independent with HEP in order to demonstrate synthesis of PT education.     Goal status: INITIAL   2.  Pt will be able to demonstrate PROM of 90 flexion in order to demonstrate functional improvement in UE function for self-care and house hold duties.     Goal status: INITIAL   3.  Pt will be able to demonstrate ability to perform full AROM without discomfort or limitation in order to demonstrate functional improvement in UE function for self-care and house hold duties.    Goal status: INITIAL     LONG TERM GOALS: Target Date 05/23/2022        Pt  will become independent with final HEP in order to demonstrate synthesis of PT education.     Goal status: INITIAL   2.  Pt will be able to demonstrate ability to perform 5lb shoulder flexion, ABD, and scaption in order to demonstrate functional improvement in UE function for self-care and house hold duties.      Goal status: INITIAL   3.  Pt will be able to demonstrate 5x full push ups without pain in order to demonstrate functional improvement in UE function for progression towards return to exercise and ability to push during normal daily activity.      Goal status: INITIAL   4.  Pt will be able to reach, lift, and  carry OH >25lbs  in order to demonstrate functional improvement in R UE strength for transition to return to occupation.     Goal status: INITIAL     PLAN: PT FREQUENCY: 1-2x/week   PT DURATION: 12 weeks   PLANNED INTERVENTIONS: Therapeutic exercises, Therapeutic activity, Neuromuscular re-education, Balance training, Gait training, Patient/Family education, Self Care, Joint mobilization, Joint manipulation, DME instructions, Aquatic Therapy, Dry Needling, Electrical stimulation, Spinal manipulation, Spinal mobilization, Cryotherapy, Moist heat, scar mobilization, Splintting, Taping, Vasopneumatic device, Traction, Ultrasound, Ionotophoresis 4mg /ml Dexamethasone, Manual therapy, and Re-evaluation   PLAN FOR NEXT SESSION: continue per [facility] repair protocol     Graceann Congress Corleen Otwell, PTA 04/26/2022, 3:07 PM

## 2022-04-28 ENCOUNTER — Telehealth (HOSPITAL_BASED_OUTPATIENT_CLINIC_OR_DEPARTMENT_OTHER): Payer: Self-pay

## 2022-04-28 ENCOUNTER — Ambulatory Visit (HOSPITAL_BASED_OUTPATIENT_CLINIC_OR_DEPARTMENT_OTHER): Payer: No Typology Code available for payment source

## 2022-04-28 NOTE — Telephone Encounter (Signed)
Attempted to leave VM regarding missed appt. No answer and mailbox full.

## 2022-05-03 ENCOUNTER — Encounter (HOSPITAL_BASED_OUTPATIENT_CLINIC_OR_DEPARTMENT_OTHER): Payer: Self-pay | Admitting: Physical Therapy

## 2022-05-03 ENCOUNTER — Ambulatory Visit (HOSPITAL_BASED_OUTPATIENT_CLINIC_OR_DEPARTMENT_OTHER): Payer: No Typology Code available for payment source | Attending: Orthopaedic Surgery | Admitting: Physical Therapy

## 2022-05-03 DIAGNOSIS — M25611 Stiffness of right shoulder, not elsewhere classified: Secondary | ICD-10-CM | POA: Insufficient documentation

## 2022-05-03 DIAGNOSIS — M6281 Muscle weakness (generalized): Secondary | ICD-10-CM | POA: Diagnosis present

## 2022-05-03 DIAGNOSIS — M25511 Pain in right shoulder: Secondary | ICD-10-CM | POA: Diagnosis present

## 2022-05-03 NOTE — Therapy (Signed)
OUTPATIENT PHYSICAL THERAPY UPPER EXTREMITY TREATMENT   Patient Name: Brandon Lawson MRN: [id number] DOB:03-19-1975, 47 y.o., male Today's Date: 05/03/2022  END OF SESSION:  PT End of Session - 05/03/22 1303     Visit Number 13    Number of Visits 25    Date for PT Re-Evaluation 05/29/22    Authorization Type Worker's Comp    PT Start Time 1315    PT Stop Time 1348    PT Time Calculation (min) 33 min    Activity Tolerance Patient tolerated treatment well    Behavior During Therapy WFL for tasks assessed/performed                     Past Medical History:  Diagnosis Date   Asthma    seasonal only- no inhaler   HLD (hyperlipidemia)    no meds, diet controlled   Hyperlipidemia    Past Surgical History:  Procedure Laterality Date   COLONOSCOPY  2023   PECTORALIS TENDON REPAIR Right 02/24/2022   Procedure: RIGHT PECTORALIS MAJOR REPAIR;  Surgeon: Vanetta Mulders, MD;  Location: [place];  Service: Orthopedics;  Laterality: Right;   Testicular torsion     at age 54 yrs old   Patient Active Problem List   Diagnosis Date Noted   Pectoralis muscle rupture 02/24/2022    PCP:  Wenda Low, MD       REFERRING PROVIDER:  Vanetta Mulders, MD     REFERRING DIAG:  S29.011A (ICD-10-CM) - Rupture of pectoralis major muscle, initial encounter      THERAPY DIAG:  Acute pain of right shoulder  Decreased right shoulder range of motion  Muscle weakness (generalized)  Rationale for Evaluation and Treatment: Rehabilitation  ONSET DATE: 02/24/22 Surgery  Days since surgery: 68   PROCEDURE: 1.  Right pectoralis major repair    SUBJECTIVE:                                                                                                                                                                                      SUBJECTIVE STATEMENT:  Pt states the pain is better into the shoulder. The shoulder movement is improving BHB is still tightest.    Eval: Pt states that pain has been fairly well managed. He has only been taking the aspirin as prescribed. It will ache on occasion but no pain. Moving around will cause it to ache. Sleeps in bed with pillows on an incline. Icing 2x a day. Keeping it on for 30-40 min at a time. Pt denies signs of infection or DVT. Requires heavy lifting, door breaching, climbing ladders, etc. At work.   PERTINENT  HISTORY: N/A  PAIN:  Are you having pain? No: NPRS scale: 0/10   PRECAUTIONS: Shoulder  WEIGHT BEARING RESTRICTIONS: Yes NWB "He will not have an abduction pillow"  FALLS:  Has patient fallen in last 6 months? 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 Complete tear of the distal tendon of the right pectoralis major muscle with medial retraction of approximately 8 cm. 2. Evolving hematoma surrounding the torn tendon stump measuring approximately 5.7 x 4.6 x 4.6 cm.  PATIENT SURVEYS :  FOTO 34 71 @ DC 23 pts MCII  FOTO 3/19 63  UPPER EXTREMITY ROM:    Passive ROM Right eval R 2/5 R  3/5 R  AROM 3/19  Shoulder flexion 65 87 160 150  Shoulder extension N/A  35 40  Shoulder abduction 60 60 135 145  Shoulder adduction       Shoulder internal rotation To belly  To belly L4 p!  Shoulder external rotation N/A To neutral 35 55  (Blank rows = not tested)    MMT: full break test not performed but pt able to perform at least 4-/5 resistance today during session              TODAY'S TREATMENT:                                                                                                                                         DATE:   4/2  UBE L2 2 min retro and  fwd  OH band stretching series: ER, flexion, and BHB 15s 4x Landmine press 15lbs 3x8 TRX row 3x8 TRX T 3x8 Cable 7.5lb IR and ER 3x10 Counter height push ups 3x10 Landmine press 15lbs plus bar 4x6 Lat pull down 3x8 70lbs   3/26  GHJ mobilizations post, and inf glides grade III STM to R infra, middle delt, pec   Supine DB press- 7lbs 2x10 Counter push up 2x10 Standing flexion 3# 2x10 Standing abduction 3# 2x10 KB carry 13# 1 lap on track, single break Reverse wall push ups 2x10 OH press 5# 1x10 Wall clock 3 pt RTB x10ea   3/19  GHJ mobilizations ant,post, and inf glides grade III-IV STM to R infra, middle delt   Self STM with tennis ball Wall push up 2x10 Landmine press- bar only 2x10 DB press 5 lbs    3/14 PROM R shoulder  GHJ mobilizations ant,post, and inf glides grade II-III Prone flexion 2x10 Prone extension 2x10 Supine press up- 5lbs 2x10 S/L ER 4# 2x10 S/L abduction 2# 2x10 to 90deg Partial wall push ups 2x10 -Quadruped arm raises 1#DB 2x10ea  3/11 PROM R shoulder flexion, abd, and extension  - Doorway Pec Stretch at 90 Degrees Abduction  - 2 x daily - 7 x weekly - 1 sets - 3 reps - 30 hold - Standing Shoulder Internal Rotation Stretch with Towel  - 2 x daily - 7 x weekly - 1 sets - 3 reps - 30 hold  -Prone I,Y,T 2x10eaR -Prone row 10#DB 2x10 -ER with RTB 2x10R -IR with GTB 2x10R -Partial wall push up 2x10 -Quadruped arm raises 1#DB 2x10ea Body blade 3way- 15" x2 ea  -Cable column single arm row 17.5lbs 2x10 -Cable column single arm shoulder ext from overhead 2x10L 12.5lbs -Cable column Triceps ext-22lbs 2x10 -Cable column adduction to neutral 12.5 lbs 2x10  3/8 PROM R shoulder flexion, abd, IR/ER  Row and curls 10-15lbs Shoulder raises 3lbs    - Doorway Pec Stretch at 90 Degrees Abduction  - 2 x daily - 7 x weekly - 1 sets - 3 reps - 30 hold - Standing Shoulder Internal Rotation Stretch with Towel  - 2 x daily - 7 x weekly - 1 sets - 3  reps - 30 hold    -ER with RTB 2x10R -IR with RTB 2x10R  -Partial wall push up 2x10 -Bent over row 3x10 10lbR -Cable column single arm row 17.5lbs -Cable column single arm shoulder ext from overhead 2x10L 12.5lbs -Cable column Triceps ext-17.5lbs 2x10 -Cable column adduction to neutral 7.5lbs 2x10  3/5 R GHJ inf and post grade III-IV STM pec into ABD 90 deg  Row and curls 10-15lbs Shoulder raises 3lbs   Wall slide pec stretch 5s 10x - Doorway Pec Stretch at 90 Degrees Abduction  - 2 x daily - 7 x weekly - 1 sets - 3 reps - 30 hold - Standing Shoulder Internal Rotation Stretch with Towel  - 2 x daily - 7 x weekly - 1 sets - 3 reps - 30 hold - Standing Shoulder Abduction AAROM with Dowel  - 2 x daily - 7 x weekly - 2 sets - 10 reps - 5 hold - Bent Over Single Arm Shoulder Row with Dumbbell  - 1 x daily - 3 x weekly - 3 sets - 10 reps - Standing Bicep Curls Supinated with Dumbbells  - 1 x daily - 3 x weekly - 3 sets - 10 reps - Triceps Extension   - 1 x daily - 3 x weekly - 3 sets - 10 reps - Shoulder Overhead Press in Flexion with Dumbbells  - 1 x daily - 3 x weekly - 3 sets - 10 reps - Standing Shoulder External Rotation with Resistance  - 1 x daily - 3 x weekly - 3 sets - 10 reps - Shoulder Abduction with Dumbbells - Palms Down  - 1 x daily - 3 x weekly - 3 sets - 10 reps       PATIENT EDUCATION: Education details: anatomy, exercise progression, DOMS expectations, HEP, POC   Person educated: Patient Education method: Explanation, Demonstration, Tactile cues, Verbal cues, and Handouts Education comprehension: verbalized understanding, returned demonstration, verbal cues required, and tactile cues required   HOME EXERCISE PROGRAM:   Access Code: [email or web address] (electronic) URL: [email or web address] Date: 02/28/2022 Prepared by: Daleen Bo      ASSESSMENT:   CLINICAL IMPRESSION:  Pt is nearly 10 wks at this time. Pt with mild ROM deficits in AROM but had  near full with new banded stretching series. Pt HEP progressed and pt given edu about precautions of no barbell bench,  pec fly, dips, or throwing type motions at this time. However, he may begin to explore other exercise in the gym as long as it is slow and graded. HEP tailored/trimmed to be rehab specific and he is cleared to start more movement variation in the gym session with moderate weight. Plan to  continue with strength as able and introduce pec fly type motions and wall plyos as tolerated. Pt would benefit from continued skilled therapy in order to reach goals and maximize functional R UE strength and ROM for full return to PLOF.    OBJECTIVE IMPAIRMENTS decreased strength, impaired UE functional use, improper body mechanics, postural dysfunction, pain, and hypermobility .    ACTIVITY LIMITATIONS community activity, occupation, dressing, bathing, self-care, and exercise/recreation .    PERSONAL FACTORS Fitness, 1-2 comorbidities, and Time since onset of injury/illness/exacerbation are also affecting patient's functional outcome.      REHAB POTENTIAL: Good   CLINICAL DECISION MAKING: Stable/uncomplicated   EVALUATION COMPLEXITY: Low   GOALS:     SHORT TERM GOALS: Target Date . 04/11/2022          Pt will become independent with HEP in order to demonstrate synthesis of PT education.     Goal status: met   2.  Pt will be able to demonstrate PROM of 90 flexion in order to demonstrate functional improvement in UE function for self-care and house hold duties.     Goal status: met   3.  Pt will be able to demonstrate ability to perform full AROM without discomfort or limitation in order to demonstrate functional improvement in UE function for self-care and house hold duties.    Goal status: ongoing     LONG TERM GOALS: Target Date 05/23/2022        Pt  will become independent with final HEP in order to demonstrate synthesis of PT education.     Goal status: ongoing    2.  Pt will be able to demonstrate ability to perform 5lb shoulder flexion, ABD, and scaption in order to demonstrate functional improvement in UE function for self-care and house hold duties.      Goal status: met   3.  Pt will be able to demonstrate 5x full push ups without pain in order to demonstrate functional improvement in UE function for progression towards return to exercise and ability to push during normal daily activity.      Goal status: ongoing   4.  Pt will be able to reach, lift, and carry OH >25lbs  in order to demonstrate functional improvement in R UE strength for transition to return to occupation.     Goal status: ongoing     PLAN: PT FREQUENCY: 1-2x/week   PT DURATION: 12 weeks   PLANNED INTERVENTIONS: Therapeutic exercises, Therapeutic activity, Neuromuscular re-education, Balance training, Gait training, Patient/Family education, Self Care, Joint mobilization, Joint manipulation, DME instructions, Aquatic Therapy, Dry Needling, Electrical stimulation, Spinal manipulation, Spinal mobilization, Cryotherapy, Moist heat, scar mobilization, Splintting, Taping, Vasopneumatic device, Traction, Ultrasound, Ionotophoresis 4mg /ml Dexamethasone, Manual therapy, and Re-evaluation   PLAN FOR NEXT SESSION: continue per [facility] repair protocol     Daleen Bo, PT 05/03/2022, 1:59 PM

## 2022-05-06 ENCOUNTER — Encounter (HOSPITAL_BASED_OUTPATIENT_CLINIC_OR_DEPARTMENT_OTHER): Payer: Self-pay

## 2022-05-06 ENCOUNTER — Ambulatory Visit (HOSPITAL_BASED_OUTPATIENT_CLINIC_OR_DEPARTMENT_OTHER): Payer: No Typology Code available for payment source

## 2022-05-06 DIAGNOSIS — M6281 Muscle weakness (generalized): Secondary | ICD-10-CM

## 2022-05-06 DIAGNOSIS — M25511 Pain in right shoulder: Secondary | ICD-10-CM | POA: Diagnosis not present

## 2022-05-06 DIAGNOSIS — M25611 Stiffness of right shoulder, not elsewhere classified: Secondary | ICD-10-CM

## 2022-05-06 NOTE — Therapy (Signed)
OUTPATIENT PHYSICAL THERAPY UPPER EXTREMITY TREATMENT   Patient Name: Brandon Lawson MRN: [id number] DOB:May 10, 1975, 47 y.o., male Today's Date: 05/06/2022  END OF SESSION:  PT End of Session - 05/06/22 1352     Visit Number 14    Number of Visits 25    Date for PT Re-Evaluation 05/29/22    Authorization Type Worker's Comp    PT Start Time 1348    PT Stop Time 1429    PT Time Calculation (min) 41 min    Activity Tolerance Patient tolerated treatment well    Behavior During Therapy WFL for tasks assessed/performed                      Past Medical History:  Diagnosis Date   Asthma    seasonal only- no inhaler   HLD (hyperlipidemia)    no meds, diet controlled   Hyperlipidemia    Past Surgical History:  Procedure Laterality Date   COLONOSCOPY  2023   PECTORALIS TENDON REPAIR Right 02/24/2022   Procedure: RIGHT PECTORALIS MAJOR REPAIR;  Surgeon: Huel Cote, MD;  Location: [facility];  Service: Orthopedics;  Laterality: Right;   Testicular torsion     at age 26 yrs old   Patient Active Problem List   Diagnosis Date Noted   Pectoralis muscle rupture 02/24/2022    PCP:  Georgann Housekeeper, MD       REFERRING PROVIDER:  Huel Cote, MD     REFERRING DIAG:  S29.011A (ICD-10-CM) - Rupture of pectoralis major muscle, initial encounter      THERAPY DIAG:  Acute pain of right shoulder  Decreased right shoulder range of motion  Muscle weakness (generalized)  Rationale for Evaluation and Treatment: Rehabilitation  ONSET DATE: 02/24/22 Surgery  Days since surgery: 71   PROCEDURE: 1.  Right pectoralis major repair    SUBJECTIVE:                                                                                                                                                                                      SUBJECTIVE STATEMENT:  Pt arrives with complaints of increased proximal bicep and deltoid discomfort.   Eval: Pt states that pain has  been fairly well managed. He has only been taking the aspirin as prescribed. It will ache on occasion but no pain. Moving around will cause it to ache. Sleeps in bed with pillows on an incline. Icing 2x a day. Keeping it on for 30-40 min at a time. Pt denies signs of infection or DVT. Requires heavy lifting, door breaching, climbing ladders, etc. At work.   PERTINENT HISTORY: N/A  PAIN:  Are  you having pain? No: NPRS scale: 0/10   PRECAUTIONS: Shoulder  WEIGHT BEARING RESTRICTIONS: Yes NWB "He will not have an abduction pillow"  FALLS:  Has patient fallen in last 6 months? 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 Complete tear of the distal tendon of the right pectoralis major muscle with medial retraction of approximately 8 cm. 2. Evolving hematoma surrounding the torn tendon stump measuring approximately 5.7 x 4.6 x 4.6 cm.  PATIENT SURVEYS :  FOTO 34 71 @ DC 23 pts MCII  FOTO 3/19 63  UPPER EXTREMITY ROM:    Passive ROM Right eval R 2/5 R  3/5 R  AROM 3/19  Shoulder flexion 65 87 160 150  Shoulder extension N/A  35 40  Shoulder abduction 60 60 135 145  Shoulder adduction       Shoulder internal rotation To belly  To belly L4 p!  Shoulder external rotation N/A To neutral 35 55  (Blank rows = not tested)    MMT: full break test not performed but pt able to perform at least 4-/5 resistance today during session              TODAY'S TREATMENT:                                                                                                                                         DATE:   4/5  UBE L2 2 min retro and fwd  GHJ mobilizations inf and  posterior glides grade II-III PROM R shoulder Posterior capsule release in s/L Scap mobilization in sidelying Instruction in post. Capsule stretch S/L ER 5lbs x20 Standing Y with GTB 2x10 Horizontal abd with GTB 2x10 Bilateral ER GTB 2x10  Lat pull down 27.8 x30   4/2  UBE L2 2 min retro and fwd  OH band stretching series: ER, flexion, and BHB 15s 4x Landmine press 15lbs 3x8 TRX row 3x8 TRX T 3x8 Cable 7.5lb IR and ER 3x10 Counter height push ups 3x10 Landmine press 15lbs plus bar 4x6 Lat pull down 3x8 70lbs   3/26  GHJ mobilizations post, and inf glides grade III STM to R infra, middle delt, pec   Supine DB press- 7lbs 2x10 Counter push up 2x10 Standing flexion 3# 2x10 Standing abduction 3# 2x10 KB carry 13# 1 lap on track, single break Reverse wall push ups 2x10 OH press 5# 1x10 Wall clock 3 pt RTB x10ea   3/19  GHJ mobilizations ant,post, and inf glides grade III-IV STM to R infra, middle delt   Self STM with tennis ball Wall push up 2x10 Landmine  press- bar only 2x10 DB press 5 lbs    3/14 PROM R shoulder  GHJ mobilizations ant,post, and inf glides grade II-III Prone flexion 2x10 Prone extension 2x10 Supine press up- 5lbs 2x10 S/L ER 4# 2x10 S/L abduction 2# 2x10 to 90deg Partial wall push ups 2x10 -Quadruped arm raises 1#DB 2x10ea    PATIENT EDUCATION: Education details: anatomy, exercise progression, DOMS expectations, HEP, POC   Person educated: Patient Education method: Explanation, Demonstration, Tactile cues, Verbal cues, and Handouts Education comprehension: verbalized understanding, returned demonstration, verbal cues required, and tactile cues required   HOME EXERCISE PROGRAM:   Access Code: [email or web address] (electronic) URL: [email or web address] Date: 02/28/2022 Prepared by: Zebedee IbaAlan Zhou      ASSESSMENT:   CLINICAL IMPRESSION:  Pt is 10 weeks s/p. Remains tender in prox. Biceps and also restricted in posterior  capsule and periscap mm. He reported improved mobility following posterior capsule release. Also performed GHJ mobilizations to improve mobility.  Regressed with biceps and pushing focused exercises today due to recent c/o biceps irritation. Will monitor this and progress if improved next visit.    OBJECTIVE IMPAIRMENTS decreased strength, impaired UE functional use, improper body mechanics, postural dysfunction, pain, and hypermobility .    ACTIVITY LIMITATIONS community activity, occupation, dressing, bathing, self-care, and exercise/recreation .    PERSONAL FACTORS Fitness, 1-2 comorbidities, and Time since onset of injury/illness/exacerbation are also affecting patient's functional outcome.      REHAB POTENTIAL: Good   CLINICAL DECISION MAKING: Stable/uncomplicated   EVALUATION COMPLEXITY: Low   GOALS:     SHORT TERM GOALS: Target Date . 04/11/2022          Pt will become independent with HEP in order to demonstrate synthesis of PT education.     Goal status: met   2.  Pt will be able to demonstrate PROM of 90 flexion in order to demonstrate functional improvement in UE function for self-care and house hold duties.     Goal status: met   3.  Pt will be able to demonstrate ability to perform full AROM without discomfort or limitation in order to demonstrate functional improvement in UE function for self-care and house hold duties.    Goal status: ongoing     LONG TERM GOALS: Target Date 05/23/2022        Pt  will become independent with final HEP in order to demonstrate synthesis of PT education.     Goal status: ongoing   2.  Pt will be able to demonstrate ability to perform 5lb shoulder flexion, ABD, and scaption in order to demonstrate functional improvement in UE function for self-care and house hold duties.      Goal status: met   3.  Pt will be able to demonstrate 5x full push ups without pain in order to demonstrate functional improvement in UE function  for progression towards return to exercise and ability to push during normal daily activity.      Goal status: ongoing   4.  Pt will be able to reach, lift, and carry OH >25lbs  in order to demonstrate functional improvement in R UE strength for transition to return to occupation.     Goal status: ongoing     PLAN: PT FREQUENCY: 1-2x/week   PT DURATION: 12 weeks   PLANNED INTERVENTIONS: Therapeutic exercises, Therapeutic activity, Neuromuscular re-education, Balance training, Gait training, Patient/Family education, Self Care, Joint mobilization, Joint manipulation, DME instructions, Aquatic Therapy, Dry Needling, Electrical stimulation, Spinal manipulation, Spinal mobilization, Cryotherapy, Moist heat, scar  mobilization, Splintting, Taping, Vasopneumatic device, Traction, Ultrasound, Ionotophoresis 4mg /ml Dexamethasone, Manual therapy, and Re-evaluation   PLAN FOR NEXT SESSION: continue per [facility] repair protocol     Donnel Saxon Marshal Schrecengost, PTA 05/06/2022, 2:55 PM

## 2022-05-09 ENCOUNTER — Ambulatory Visit
Admission: RE | Admit: 2022-05-09 | Discharge: 2022-05-09 | Disposition: A | Discharge status: Home / Self Care | Payer: 59 | Source: Ambulatory Visit | Attending: Orthopaedic Surgery | Admitting: Orthopaedic Surgery

## 2022-05-09 DIAGNOSIS — M7652 Patellar tendinitis, left knee: Secondary | ICD-10-CM

## 2022-05-10 ENCOUNTER — Ambulatory Visit (HOSPITAL_BASED_OUTPATIENT_CLINIC_OR_DEPARTMENT_OTHER): Payer: No Typology Code available for payment source | Admitting: Physical Therapy

## 2022-05-10 ENCOUNTER — Encounter (HOSPITAL_BASED_OUTPATIENT_CLINIC_OR_DEPARTMENT_OTHER): Payer: Self-pay | Admitting: Physical Therapy

## 2022-05-10 DIAGNOSIS — M25511 Pain in right shoulder: Secondary | ICD-10-CM | POA: Diagnosis not present

## 2022-05-10 DIAGNOSIS — M25611 Stiffness of right shoulder, not elsewhere classified: Secondary | ICD-10-CM

## 2022-05-10 DIAGNOSIS — M6281 Muscle weakness (generalized): Secondary | ICD-10-CM

## 2022-05-10 NOTE — Therapy (Signed)
OUTPATIENT PHYSICAL THERAPY UPPER EXTREMITY TREATMENT   Patient Name: Brandon Lawson MRN: [id number] DOB:[birth year], 47 y.o., male Today's Date: 05/10/2022  END OF SESSION:  PT End of Session - 05/10/22 1322     Visit Number 15    Number of Visits 25    Date for PT Re-Evaluation 05/29/22    Authorization Type Worker's Comp    PT Start Time 1316    PT Stop Time 1355    PT Time Calculation (min) 39 min    Activity Tolerance Patient tolerated treatment well    Behavior During Therapy WFL for tasks assessed/performed                       Past Medical History:  Diagnosis Date   Asthma    seasonal only- no inhaler   HLD (hyperlipidemia)    no meds, diet controlled   Hyperlipidemia    Past Surgical History:  Procedure Laterality Date   COLONOSCOPY  2023   PECTORALIS TENDON REPAIR Right 02/24/2022   Procedure: RIGHT PECTORALIS MAJOR REPAIR;  Surgeon: Huel Cote, MD;  Location: [facility] OR;  Service: Orthopedics;  Laterality: Right;   Testicular torsion     at age 29 yrs old   Patient Active Problem List   Diagnosis Date Noted   Pectoralis muscle rupture 02/24/2022    PCP:  Georgann Housekeeper, MD       REFERRING PROVIDER:  Huel Cote, MD     REFERRING DIAG:  S29.011A (ICD-10-CM) - Rupture of pectoralis major muscle, initial encounter      THERAPY DIAG:  Acute pain of right shoulder  Decreased right shoulder range of motion  Muscle weakness (generalized)  Rationale for Evaluation and Treatment: Rehabilitation  ONSET DATE: 02/24/22 Surgery  Days since surgery: 75   PROCEDURE: 1.  Right pectoralis major repair    SUBJECTIVE:                                                                                                                                                                                      SUBJECTIVE STATEMENT:  Pt states that taking it easier with the weights, it has felt much better. No increase in pain since last session.    Eval: Pt states that pain has been fairly well managed. He has only been taking the aspirin as prescribed. It will ache on occasion but no pain. Moving around will cause it to ache. Sleeps in bed with pillows on an incline. Icing 2x a day. Keeping it on for 30-40 min at a time. Pt denies signs of infection or DVT. Requires heavy lifting, door breaching, climbing ladders, etc.  At work.   PERTINENT HISTORY: N/A  PAIN:  Are you having pain? No: NPRS scale: 0/10   PRECAUTIONS: Shoulder  WEIGHT BEARING RESTRICTIONS:no  FALLS:  Has patient fallen in last 6 months? 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 Complete tear of the distal tendon of the right pectoralis major muscle with medial retraction of approximately 8 cm. 2. Evolving hematoma surrounding the torn tendon stump measuring approximately 5.7 x 4.6 x 4.6 cm.  PATIENT SURVEYS :  FOTO 34 71 @ DC 23 pts [facility]  FOTO 3/19 63  UPPER EXTREMITY ROM:    Passive ROM Right eval R 2/5 R  3/5 R  AROM 3/19  Shoulder flexion 65 87 160 150  Shoulder extension N/A  35 40  Shoulder abduction 60 60 135 145  Shoulder adduction       Shoulder internal rotation To belly  To belly L4 p!  Shoulder external rotation N/A To neutral 35 55  (Blank rows = not tested)    MMT: full break test not performed but pt able to perform at least 4-/5 resistance today during session              TODAY'S TREATMENT:                                                                                                                                         DATE:   4/9  UBE L2 2 min retro and fwd  GHJ mobilizations inf, ant, and posterior  glides grade III-IV Supine unweighted pec fly 2x10 3s Posterior capsule release in s/L Sleeper stretch 30s 3x Posterior capsule 30s 3x Pec stretch at 120 30s 3x    4/5  UBE L2 2 min retro and fwd  GHJ mobilizations inf and posterior glides grade II-III PROM R shoulder Posterior capsule release in s/L Scap mobilization in sidelying Instruction in post. Capsule stretch S/L ER 5lbs x20 Standing Y with GTB 2x10 Horizontal abd with GTB 2x10 Bilateral ER GTB 2x10  Lat pull down 27.8 x30   4/2  UBE L2 2 min retro and fwd  OH band stretching series: ER, flexion, and BHB 15s 4x Landmine press 15lbs 3x8 TRX row 3x8 TRX T 3x8 Cable 7.5lb IR and ER 3x10 Counter height push ups 3x10 Landmine press 15lbs plus bar 4x6 Lat pull down 3x8 70lbs   3/26  GHJ mobilizations post, and inf glides grade III STM to R infra, middle delt, pec   Supine DB press- 7lbs 2x10 Counter push up 2x10 Standing flexion 3# 2x10 Standing abduction 3# 2x10 KB carry 13# 1 lap  on track, single break Reverse wall push ups 2x10 OH press 5# 1x10 Wall clock 3 pt RTB x10ea   3/19  GHJ mobilizations ant,post, and inf glides grade III-IV STM to R infra, middle delt   Self STM with tennis ball Wall push up 2x10 Landmine press- bar only 2x10 DB press 5 lbs    3/14 PROM R shoulder  GHJ mobilizations ant,post, and inf glides grade II-III Prone flexion 2x10 Prone extension 2x10 Supine press up- 5lbs 2x10 S/L ER 4# 2x10 S/L abduction 2# 2x10 to 90deg Partial wall push ups 2x10 -Quadruped arm raises 1#DB 2x10ea    PATIENT EDUCATION: Education details: anatomy, exercise progression, DOMS expectations, HEP, POC   Person educated: Patient Education method: Explanation, Demonstration, Tactile cues, Verbal cues, and Handouts Education comprehension: verbalized understanding, returned demonstration, verbal cues required, and tactile cues required   HOME EXERCISE PROGRAM:   Access Code: [email or web address]  (electronic) URL: [email or web address] Date: 02/28/2022 Prepared by: Zebedee Iba      ASSESSMENT:   CLINICAL IMPRESSION:  Pt is nearly 11 weeks s/p. Pt session focused on R shoulder mobility and soft tissue mobility of the R anterior and posterior shoulder. Though no increase in pain at start of session, pt plans on retrying weighted exercise today in the gym setting following treatment session. Pt advised to continue with graded return to HEP to reduce further irritation. Pt should be nearly 12 wks at next visit and able to progress next phase of rehab. Pt is progressing well with emphasis placed on recovery. Pt would benefit from continued skilled therapy in order to reach goals and maximize functional R UE strength and ROM for full return to PLOF.    OBJECTIVE IMPAIRMENTS decreased strength, impaired UE functional use, improper body mechanics, postural dysfunction, pain, and hypermobility .    ACTIVITY LIMITATIONS community activity, occupation, dressing, bathing, self-care, and exercise/recreation .    PERSONAL FACTORS Fitness, 1-2 comorbidities, and Time since onset of injury/illness/exacerbation are also affecting patient's functional outcome.      REHAB POTENTIAL: Good   CLINICAL DECISION MAKING: Stable/uncomplicated   EVALUATION COMPLEXITY: Low   GOALS:     SHORT TERM GOALS: Target Date . 04/11/2022          Pt will become independent with HEP in order to demonstrate synthesis of PT education.     Goal status: met   2.  Pt will be able to demonstrate PROM of 90 flexion in order to demonstrate functional improvement in UE function for self-care and house hold duties.     Goal status: met   3.  Pt will be able to demonstrate ability to perform full AROM without discomfort or limitation in order to demonstrate functional improvement in UE function for self-care and house hold duties.    Goal status: ongoing     LONG TERM GOALS: Target Date  05/23/2022        Pt  will become independent with final HEP in order to demonstrate synthesis of PT education.     Goal status: ongoing   2.  Pt will be able to demonstrate ability to perform 5lb shoulder flexion, ABD, and scaption in order to demonstrate functional improvement in UE function for self-care and house hold duties.      Goal status: met   3.  Pt will be able to demonstrate 5x full push ups without pain in order to demonstrate functional improvement in UE function for progression towards return to exercise and ability to push during  normal daily activity.      Goal status: ongoing   4.  Pt will be able to reach, lift, and carry OH >25lbs  in order to demonstrate functional improvement in R UE strength for transition to return to occupation.     Goal status: ongoing     PLAN: PT FREQUENCY: 1-2x/week   PT DURATION: 12 weeks   PLANNED INTERVENTIONS: Therapeutic exercises, Therapeutic activity, Neuromuscular re-education, Balance training, Gait training, Patient/Family education, Self Care, Joint mobilization, Joint manipulation, DME instructions, Aquatic Therapy, Dry Needling, Electrical stimulation, Spinal manipulation, Spinal mobilization, Cryotherapy, Moist heat, scar mobilization, Splintting, Taping, Vasopneumatic device, Traction, Ultrasound, Ionotophoresis 4mg /ml Dexamethasone, Manual therapy, and Re-evaluation   PLAN FOR NEXT SESSION: continue per [facility] repair protocol     Zebedee Iba, PT 05/10/2022, 1:58 PM

## 2022-05-13 ENCOUNTER — Encounter (HOSPITAL_BASED_OUTPATIENT_CLINIC_OR_DEPARTMENT_OTHER): Payer: 59

## 2022-05-17 ENCOUNTER — Ambulatory Visit (HOSPITAL_BASED_OUTPATIENT_CLINIC_OR_DEPARTMENT_OTHER): Payer: No Typology Code available for payment source

## 2022-05-17 ENCOUNTER — Encounter (HOSPITAL_BASED_OUTPATIENT_CLINIC_OR_DEPARTMENT_OTHER): Payer: Self-pay

## 2022-05-17 DIAGNOSIS — M25511 Pain in right shoulder: Secondary | ICD-10-CM

## 2022-05-17 DIAGNOSIS — M25611 Stiffness of right shoulder, not elsewhere classified: Secondary | ICD-10-CM

## 2022-05-17 DIAGNOSIS — M6281 Muscle weakness (generalized): Secondary | ICD-10-CM

## 2022-05-17 NOTE — Therapy (Signed)
OUTPATIENT PHYSICAL THERAPY UPPER EXTREMITY TREATMENT   Patient Name: Brandon Lawson MRN: [id number] DOB:[birth year], 47 y.o., male Today's Date: 05/17/2022  END OF SESSION:  PT End of Session - 05/17/22 1456     Visit Number 16    Number of Visits 25    Date for PT Re-Evaluation 05/29/22    Authorization Type Worker's Comp    PT Start Time 1345    PT Stop Time 1428    PT Time Calculation (min) 43 min    Activity Tolerance Patient tolerated treatment well    Behavior During Therapy WFL for tasks assessed/performed                        Past Medical History:  Diagnosis Date   Asthma    seasonal only- no inhaler   HLD (hyperlipidemia)    no meds, diet controlled   Hyperlipidemia    Past Surgical History:  Procedure Laterality Date   COLONOSCOPY  2023   PECTORALIS TENDON REPAIR Right 02/24/2022   Procedure: RIGHT PECTORALIS MAJOR REPAIR;  Surgeon: Huel Cote, MD;  Location: [facility] OR;  Service: Orthopedics;  Laterality: Right;   Testicular torsion     at age 53 yrs old   Patient Active Problem List   Diagnosis Date Noted   Pectoralis muscle rupture 02/24/2022    PCP:  Georgann Housekeeper, MD       REFERRING PROVIDER:  Huel Cote, MD     REFERRING DIAG:  S29.011A (ICD-10-CM) - Rupture of pectoralis major muscle, initial encounter      THERAPY DIAG:  Acute pain of right shoulder  Muscle weakness (generalized)  Decreased right shoulder range of motion  Rationale for Evaluation and Treatment: Rehabilitation  ONSET DATE: 02/24/22 Surgery  Days since surgery: 82   PROCEDURE: 1.  Right pectoralis major repair    SUBJECTIVE:                                                                                                                                                                                      SUBJECTIVE STATEMENT:  Pt reports he had MRI for his knee which shows a meniscus tear. He states his shoulder hurts on/off. He was in  [place] last weekend and wasn't able to complete full HEP or his gym program.   Eval: Pt states that pain has been fairly well managed. He has only been taking the aspirin as prescribed. It will ache on occasion but no pain. Moving around will cause it to ache. Sleeps in bed with pillows on an incline. Icing 2x a day. Keeping it on for 30-40 min at a  time. Pt denies signs of infection or DVT. Requires heavy lifting, door breaching, climbing ladders, etc. At work.   PERTINENT HISTORY: N/A  PAIN:  Are you having pain? No: NPRS scale: 0/10   PRECAUTIONS: Shoulder  WEIGHT BEARING RESTRICTIONS:no  FALLS:  Has patient fallen in last 6 months? 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 Complete tear of the distal tendon of the right pectoralis major muscle with medial retraction of approximately 8 cm. 2. Evolving hematoma surrounding the torn tendon stump measuring approximately 5.7 x 4.6 x 4.6 cm.  PATIENT SURVEYS :  FOTO 34 71 @ DC 23 pts [facility]II  FOTO 3/19 63  UPPER EXTREMITY ROM:    Passive ROM Right eval R 2/5 R  3/5 R  AROM 3/19  Shoulder flexion 65 87 160 150  Shoulder extension N/A  35 40  Shoulder abduction 60 60 135 145  Shoulder adduction       Shoulder internal rotation To belly  To belly L4 p!  Shoulder external rotation N/A To neutral 35 55  (Blank rows = not tested)    MMT: full break test not performed but pt able to perform at least 4-/5 resistance today during session              TODAY'S TREATMENT:                                                                                                                                         DATE:     4/16  UBE L4 2 min retro and fwd  GHJ mobilizations inf, ant, and posterior glides grade III Supine 2# pec fly 2x10  Sleeper stretch 30s 3x Posterior capsule 30s 3x Pec stretch at doorway  30s 3x  Push up on knees 3x10 Front plank with alt shoulder taps x10ea Front plank with TB clock RTB-x5ea Sidelying H abduction-1# 2x10 Overhead press with DB- 5# x10, 7# 2x10 Press out fwd at cable column- 15lbs 2x10 Low row at cable column 25lbs 2x10   4/9  UBE L2 2 min retro and fwd  GHJ mobilizations inf, ant, and posterior glides grade III-IV Supine unweighted pec fly 2x10 3s Posterior capsule release in s/L Sleeper stretch 30s 3x Posterior capsule 30s 3x Pec stretch at 120 30s 3x    4/5  UBE L2 2 min retro and fwd  GHJ mobilizations inf and posterior glides grade II-III PROM R shoulder Posterior capsule release in s/L Scap mobilization in sidelying Instruction in post. Capsule stretch S/L ER 5lbs x20 Standing Y with GTB 2x10 Horizontal abd with GTB 2x10 Bilateral ER  GTB 2x10  Lat pull down 27.8 x30   4/2  UBE L2 2 min retro and fwd  OH band stretching series: ER, flexion, and BHB 15s 4x Landmine press 15lbs 3x8 TRX row 3x8 TRX T 3x8 Cable 7.5lb IR and ER 3x10 Counter height push ups 3x10 Landmine press 15lbs plus bar 4x6 Lat pull down 3x8 70lbs     PATIENT EDUCATION: Education details: anatomy, exercise progression, DOMS expectations, HEP, POC   Person educated: Patient Education method: Explanation, Demonstration, Tactile cues, Verbal cues, and Handouts Education comprehension: verbalized understanding, returned demonstration, verbal cues required, and tactile cues required   HOME EXERCISE PROGRAM:   Access Code: [email or web address] (electronic) URL: [email or web address] Date: 02/28/2022 Prepared by: Zebedee Iba      ASSESSMENT:   CLINICAL IMPRESSION:  Pt is 12 weeks s/p. He remains tight into R GHJ so continued with MT including  joint mobilizations and end range PROM. Able to progress WB strengthening and stability without complaint of pain. Added light weight pec flys in supine position without adverse response. Some soreness in shoulder by end of session. Instructed pt to use ice as needed. Will continue to progress as tolerated.     OBJECTIVE IMPAIRMENTS decreased strength, impaired UE functional use, improper body mechanics, postural dysfunction, pain, and hypermobility .    ACTIVITY LIMITATIONS community activity, occupation, dressing, bathing, self-care, and exercise/recreation .    PERSONAL FACTORS Fitness, 1-2 comorbidities, and Time since onset of injury/illness/exacerbation are also affecting patient's functional outcome.      REHAB POTENTIAL: Good   CLINICAL DECISION MAKING: Stable/uncomplicated   EVALUATION COMPLEXITY: Low   GOALS:     SHORT TERM GOALS: Target Date . 04/11/2022          Pt will become independent with HEP in order to demonstrate synthesis of PT education.     Goal status: met   2.  Pt will be able to demonstrate PROM of 90 flexion in order to demonstrate functional improvement in UE function for self-care and house hold duties.     Goal status: met   3.  Pt will be able to demonstrate ability to perform full AROM without discomfort or limitation in order to demonstrate functional improvement in UE function for self-care and house hold duties.    Goal status: ongoing     LONG TERM GOALS: Target Date 05/23/2022        Pt  will become independent with final HEP in order to demonstrate synthesis of PT education.     Goal status: ongoing   2.  Pt will be able to demonstrate ability to perform 5lb shoulder flexion, ABD, and scaption in order to demonstrate functional improvement in UE function for self-care and house hold duties.      Goal status: met   3.  Pt will be able to demonstrate 5x full push ups without pain in order to demonstrate functional improvement in  UE function for progression towards return to exercise and ability to push during normal daily activity.      Goal status: ongoing   4.  Pt will be able to reach, lift, and carry OH >25lbs  in order to demonstrate functional improvement in R UE strength for transition to return to occupation.     Goal status: ongoing     PLAN: PT FREQUENCY: 1-2x/week   PT DURATION: 12 weeks   PLANNED INTERVENTIONS: Therapeutic exercises, Therapeutic activity, Neuromuscular re-education, Balance training, Gait training, Patient/Family education, Self Care, Joint mobilization, Joint manipulation,  DME instructions, Aquatic Therapy, Dry Needling, Electrical stimulation, Spinal manipulation, Spinal mobilization, Cryotherapy, Moist heat, scar mobilization, Splintting, Taping, Vasopneumatic device, Traction, Ultrasound, Ionotophoresis 4mg /ml Dexamethasone, Manual therapy, and Re-evaluation   PLAN FOR NEXT SESSION: continue per [facility] repair protocol     Donnel Saxon Alan Drummer, PTA 05/17/2022, 5:05 PM

## 2022-05-19 ENCOUNTER — Ambulatory Visit (INDEPENDENT_AMBULATORY_CARE_PROVIDER_SITE_OTHER): Payer: No Typology Code available for payment source | Admitting: Orthopaedic Surgery

## 2022-05-19 ENCOUNTER — Ambulatory Visit (HOSPITAL_BASED_OUTPATIENT_CLINIC_OR_DEPARTMENT_OTHER): Payer: 59 | Admitting: Orthopaedic Surgery

## 2022-05-19 DIAGNOSIS — S83242A Other tear of medial meniscus, current injury, left knee, initial encounter: Secondary | ICD-10-CM

## 2022-05-19 DIAGNOSIS — S29011A Strain of muscle and tendon of front wall of thorax, initial encounter: Secondary | ICD-10-CM

## 2022-05-19 DIAGNOSIS — M25562 Pain in left knee: Secondary | ICD-10-CM

## 2022-05-19 MED ORDER — TRIAMCINOLONE ACETONIDE 40 MG/ML IJ SUSP
80.0000 mg | INTRAMUSCULAR | Status: AC | PRN
  Administered 2022-05-19: 80 mg via INTRA_ARTICULAR

## 2022-05-19 MED ORDER — LIDOCAINE HCL 1 % IJ SOLN
4.0000 mL | INTRAMUSCULAR | Status: AC | PRN
  Administered 2022-05-19: 4 mL

## 2022-05-19 NOTE — Progress Notes (Signed)
Chief Complaint: Left knee pain     History of Present Illness:   05/19/2022: Brandon Lawson presents today for MRI follow-up of his left knee.  Overall he has been running less and as result is having somewhat less pain although this is persistently medial when he does attempt to run.  Brandon Lawson is a 47 y.o. male presents today with a longstanding chronic history of left knee pain.  He states that he did have Mining engineer disease as a kid and was told that this would eventually resolve which it really has not.  He has previously had physical therapy at a Brandon Lawson age with stretching of the patella tendon without any relief.  At today's visit he is having persistent pain and inability to kneel down as he is tender predominantly over the tibial tubercle.  He does enjoy running and is very active although he has not been able to run as a result of this as well as a medial based joint pain.  At this time this is restricting his ability to be active and as result he is hoping to get this assessed.    Surgical History:   None with regard to left knee  PMH/PSH/Family History/Social History/Meds/Allergies:    Past Medical History:  Diagnosis Date  . Asthma    seasonal only- no inhaler  . HLD (hyperlipidemia)    no meds, diet controlled  . Hyperlipidemia    Past Surgical History:  Procedure Laterality Date  . COLONOSCOPY  2023  . PECTORALIS TENDON REPAIR Right 02/24/2022   Procedure: RIGHT PECTORALIS MAJOR REPAIR;  Surgeon: Huel Cote, MD;  Location: [facility] OR;  Service: Orthopedics;  Laterality: Right;  . Testicular torsion     at age 21 yrs old   Social History   Socioeconomic History  . Marital status: Married    Spouse name: Not on file  . Number of children: 3  . Years of education: Not on file  . Highest education level: Bachelor's degree (e.g., BA, AB, BS)  Occupational History  . 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PT    Employer: [employer]   Tobacco Use  . Smoking status: Former  . Smokeless tobacco: Current    Types: Chew  . Tobacco comments:    every now and then  while in college only  Vaping Use  . Vaping Use: Never used  Substance and Sexual Activity  . Alcohol use: Yes    Alcohol/week: 3.0 - 5.0 standard drinks of alcohol    Types: 3 - 5 Glasses of wine per week    Comment: Socially   . Drug use: No  . Sexual activity: Yes  Other Topics Concern  . Not on file  Social History Narrative  . Not on file   Social Determinants of Health   Financial Resource Strain: Not on file  Food Insecurity: Not on file  Transportation Needs: Not on file  Physical Activity: Sufficiently Active (04/14/2017)   Exercise Vital Sign   . Days of Exercise per Week: 5 days   . Minutes of Exercise per Session: 30 min  Stress: Not on file  Social Connections: Not on file   Family History  Problem Relation Age of Onset  . Hypertension Mother   . Hyperlipidemia Mother   . Heart attack Father   . Heart  disease Father   . Coronary artery disease Father   . Heart attack Maternal Grandmother    No Known Allergies Current Outpatient Medications  Medication Sig Dispense Refill  . aspirin EC 325 MG tablet Take 1 tablet (325 mg total) by mouth daily. (Patient taking differently: Take 325 mg by mouth daily. For after surgery) 30 tablet 0  . fluticasone (FLONASE) 50 MCG/ACT nasal spray Place 1 spray into both nostrils daily.    . naphazoline-glycerin (CLEAR EYES REDNESS) 0.012-0.2 % SOLN Place 1-2 drops into both eyes 4 (four) times daily as needed for eye irritation.    . naproxen (NAPROSYN) 500 MG tablet Take 1 tablet (500 mg total) by mouth 2 (two) times daily. (Patient not taking: Reported on 02/21/2022) 30 tablet 0  . Omega-3 1000 MG CAPS Take 1,000 mg by mouth daily.    Marland Kitchen oxyCODONE (OXY IR/ROXICODONE) 5 MG immediate release tablet Take 1 tablet (5 mg total) by mouth every 4 (four) hours as needed (severe pain). 20 tablet 0   No  current facility-administered medications for this visit.   No results found.  Review of Systems:   A ROS was performed including pertinent positives and negatives as documented in the HPI.  Physical Exam :   Constitutional: NAD and appears stated age Neurological: Alert and oriented Psych: Appropriate affect and cooperative There were no vitals taken for this visit.   Comprehensive Musculoskeletal Exam:    There is tenderness about the tibial tubercle as well as the medial joint space.  Positive McMurray medially.  Negative Lachman, negative posterior drawer, no laxity with varus or valgus stress.  Range of motion is from -3 to 135 degrees.  No effusion.  Imaging:   Xray (4 views left knee): Significant previous Osgood Slaughter lesion   MRI left knee: There is a degenerative appearing posterior medial meniscal tear without evidence of root involvement  I personally reviewed and interpreted the radiographs.   Assessment:   47 y.o. male with left knee pain consistent with left extensor tendinitis at the tibial insertion where his Osgood Slaughter lesion has been chronically painful.  At this time I do believe the majority of his symptoms are coming from a medial meniscal tear.  We did discuss treatment options.  Specifically I do believe that we can begin with injection on the left knee and a basic home program for the left knee as well.  I will plan to order this with physical therapy.  In 3 months for the left knee  Plan :    -Left knee ultrasound-guided injection performed after consent obtained    Procedure Note  Patient: Brandon Lawson             Date of Birth: 27-Aug-1975           MRN: [id number]             Visit Date: 05/19/2022  Procedures: Visit Diagnoses: No diagnosis found.  Large Joint Inj: L knee on 05/19/2022 11:57 AM Indications: pain Details: 22 G 1.5 in needle, ultrasound-guided anterior approach  Arthrogram: No  Medications: 4 mL lidocaine 1 %;  80 mg triamcinolone acetonide 40 MG/ML Outcome: tolerated well, no immediate complications Procedure, treatment alternatives, risks and benefits explained, specific risks discussed. Consent was given by the patient. Immediately prior to procedure a time out was called to verify the correct patient, procedure, equipment, support staff and site/side marked as required. Patient was prepped and draped in the usual sterile fashion.  I personally saw and evaluated the patient, and participated in the management and treatment plan.  Vanetta Mulders, MD Attending Physician, Orthopedic Surgery  This document was dictated using Dragon voice recognition software. A reasonable attempt at proof reading has been made to minimize errors.

## 2022-05-19 NOTE — Progress Notes (Signed)
Post Operative Evaluation    Procedure/Date of Surgery: Right pectoralis major repair 02/24/22  Interval History:    Presents today for follow-up of his right pectoralis major repair.  Overall he is doing very well.  He is working on regaining strength and at this time is able to do modified patient.  Overall he has no pain in the shoulder is doing much better.   PMH/PSH/Family History/Social History/Meds/Allergies:    Past Medical History:  Diagnosis Date   Asthma    seasonal only- no inhaler   HLD (hyperlipidemia)    no meds, diet controlled   Hyperlipidemia    Past Surgical History:  Procedure Laterality Date   COLONOSCOPY  2023   PECTORALIS TENDON REPAIR Right 02/24/2022   Procedure: RIGHT PECTORALIS MAJOR REPAIR;  Surgeon: Huel Cote, MD;  Location: [facility] OR;  Service: Orthopedics;  Laterality: Right;   Testicular torsion     at age 32 yrs old   Social History   Socioeconomic History   Marital status: Married    Spouse name: Not on file   Number of children: 3   Years of education: Not on file   Highest education level: Bachelor's degree (e.g., BA, AB, BS)  Occupational History   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PT    Employer: [employer]  Tobacco Use   Smoking status: Former   Smokeless tobacco: Current    Types: Chew   Tobacco comments:    every now and then  while in college only  Vaping Use   Vaping Use: Never used  Substance and Sexual Activity   Alcohol use: Yes    Alcohol/week: 3.0 - 5.0 standard drinks of alcohol    Types: 3 - 5 Glasses of wine per week    Comment: Socially    Drug use: No   Sexual activity: Yes  Other Topics Concern   Not on file  Social History Narrative   Not on file   Social Determinants of Health   Financial Resource Strain: Not on file  Food Insecurity: Not on file  Transportation Needs: Not on file  Physical Activity: Sufficiently Active (04/14/2017)   Exercise Vital Sign    Days of  Exercise per Week: 5 days    Minutes of Exercise per Session: 30 min  Stress: Not on file  Social Connections: Not on file   Family History  Problem Relation Age of Onset   Hypertension Mother    Hyperlipidemia Mother    Heart attack Father    Heart disease Father    Coronary artery disease Father    Heart attack Maternal Grandmother    No Known Allergies Current Outpatient Medications  Medication Sig Dispense Refill   aspirin EC 325 MG tablet Take 1 tablet (325 mg total) by mouth daily. (Patient taking differently: Take 325 mg by mouth daily. For after surgery) 30 tablet 0   fluticasone (FLONASE) 50 MCG/ACT nasal spray Place 1 spray into both nostrils daily.     naphazoline-glycerin (CLEAR EYES REDNESS) 0.012-0.2 % SOLN Place 1-2 drops into both eyes 4 (four) times daily as needed for eye irritation.     naproxen (NAPROSYN) 500 MG tablet Take 1 tablet (500 mg total) by mouth 2 (two) times daily. (Patient not taking: Reported on 02/21/2022) 30 tablet 0   Omega-3 1000 MG CAPS  Take 1,000 mg by mouth daily.     oxyCODONE (OXY IR/ROXICODONE) 5 MG immediate release tablet Take 1 tablet (5 mg total) by mouth every 4 (four) hours as needed (severe pain). 20 tablet 0   No current facility-administered medications for this visit.   No results found.  Review of Systems:   A ROS was performed including pertinent positives and negatives as documented in the HPI.   Musculoskeletal Exam:    There were no vitals taken for this visit.  Right shoulder incision is healed.  No palpable defect in the axillary fold.  Improved pectoralis tone.  Forward active elevation is to 170 degrees equal to contralateral side.  External rotation is to 50 degrees bilaterally.  Internal rotation is to L1 bilaterally.  Is able to flex and extend at the right elbow and right wrist.  Fires EPL.  2+ radial pulse  Imaging:      I personally reviewed and interpreted the radiographs.   Assessment:   12 weeks  status post right pectoralis major tendon repair overall doing very well.  His job requirement at this time is to continue driving.  To that effect I do believe he may return to work at this time.  I will plan to see him back for 24-month check and reassessment.  All limitations or restrictions were discussed.  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  Huel Cote, MD Attending Physician, Orthopedic Surgery  This document was dictated using Dragon voice recognition software. A reasonable attempt at proof reading has been made to minimize errors.

## 2022-05-20 ENCOUNTER — Ambulatory Visit (HOSPITAL_BASED_OUTPATIENT_CLINIC_OR_DEPARTMENT_OTHER): Payer: 59 | Admitting: Orthopaedic Surgery

## 2022-05-20 ENCOUNTER — Ambulatory Visit (HOSPITAL_BASED_OUTPATIENT_CLINIC_OR_DEPARTMENT_OTHER): Payer: No Typology Code available for payment source

## 2022-05-23 ENCOUNTER — Ambulatory Visit (HOSPITAL_BASED_OUTPATIENT_CLINIC_OR_DEPARTMENT_OTHER): Payer: No Typology Code available for payment source | Admitting: Physical Therapy

## 2022-05-24 ENCOUNTER — Ambulatory Visit (HOSPITAL_BASED_OUTPATIENT_CLINIC_OR_DEPARTMENT_OTHER): Payer: No Typology Code available for payment source

## 2022-05-24 ENCOUNTER — Encounter (HOSPITAL_BASED_OUTPATIENT_CLINIC_OR_DEPARTMENT_OTHER): Payer: Self-pay

## 2022-05-24 DIAGNOSIS — M6281 Muscle weakness (generalized): Secondary | ICD-10-CM

## 2022-05-24 DIAGNOSIS — M25511 Pain in right shoulder: Secondary | ICD-10-CM

## 2022-05-24 DIAGNOSIS — M25611 Stiffness of right shoulder, not elsewhere classified: Secondary | ICD-10-CM

## 2022-05-24 NOTE — Therapy (Unsigned)
OUTPATIENT PHYSICAL THERAPY UPPER EXTREMITY TREATMENT   Patient Name: Brandon Lawson MRN: [id number] DOB:Dec 17, 1975, 47 y.o., male Today's Date: 05/25/2022  END OF SESSION:  PT End of Session - 05/24/22 1436     Visit Number 17    Number of Visits 25    Date for PT Re-Evaluation 05/29/22    Authorization Type Worker's Comp    PT Start Time 1347    PT Stop Time 1430    PT Time Calculation (min) 43 min                         Past Medical History:  Diagnosis Date   Asthma    seasonal only- no inhaler   HLD (hyperlipidemia)    no meds, diet controlled   Hyperlipidemia    Past Surgical History:  Procedure Laterality Date   COLONOSCOPY  2023   PECTORALIS TENDON REPAIR Right 02/24/2022   Procedure: RIGHT PECTORALIS MAJOR REPAIR;  Surgeon: Huel Cote, MD;  Location: [facility] OR;  Service: Orthopedics;  Laterality: Right;   Testicular torsion     at age 42 yrs old   Patient Active Problem List   Diagnosis Date Noted   Pectoralis muscle rupture 02/24/2022    PCP:  Georgann Housekeeper, MD       REFERRING PROVIDER:  Huel Cote, MD     REFERRING DIAG:  S29.011A (ICD-10-CM) - Rupture of pectoralis major muscle, initial encounter      THERAPY DIAG:  Acute pain of right shoulder  Muscle weakness (generalized)  Decreased right shoulder range of motion  Rationale for Evaluation and Treatment: Rehabilitation  ONSET DATE: 02/24/22 Surgery  Days since surgery: 89   PROCEDURE: 1.  Right pectoralis major repair    SUBJECTIVE:                                                                                                                                                                                      SUBJECTIVE STATEMENT:  Pt was cleared by MD to RTW full duty this Thursday. Has some anterior shoulder tightness.   Eval: Pt states that pain has been fairly well managed. He has only been taking the aspirin as prescribed. It will ache on  occasion but no pain. Moving around will cause it to ache. Sleeps in bed with pillows on an incline. Icing 2x a day. Keeping it on for 30-40 min at a time. Pt denies signs of infection or DVT. Requires heavy lifting, door breaching, climbing ladders, etc. At work.   PERTINENT HISTORY: N/A  PAIN:  Are you having pain? No: NPRS scale: 0/10   PRECAUTIONS: Shoulder  WEIGHT BEARING RESTRICTIONS:no  FALLS:  Has patient fallen in last 6 months? 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 Complete tear of the distal tendon of the right pectoralis major muscle with medial retraction of approximately 8 cm. 2. Evolving hematoma surrounding the torn tendon stump measuring approximately 5.7 x 4.6 x 4.6 cm.  PATIENT SURVEYS :  FOTO 34 71 @ DC 23 pts [facility]  FOTO 3/19 63  UPPER EXTREMITY ROM:    Passive ROM Right eval R 2/5 R  3/5 R  AROM 3/19  Shoulder flexion 65 87 160 150  Shoulder extension N/A  35 40  Shoulder abduction 60 60 135 145  Shoulder adduction       Shoulder internal rotation To belly  To belly L4 p!  Shoulder external rotation N/A To neutral 35 55  (Blank rows = not tested)    MMT: full break test not performed but pt able to perform at least 4-/5 resistance today during session              TODAY'S TREATMENT:                                                                                                                                         DATE:     4/23  UBE L4 2 min retro and fwd  GHJ mobilizations inf, posterior II-III Supine 5# pec fly 3x10  Sleeper stretch 30s 3x Posterior capsule 30s 3x Pec stretch at doorway  30s 3x  Push up on  knees 2x10 Push up at table x10 Full push up x5 Front plank with alt shoulder taps x10ea Plank with trunk rotation x5ea Front plank with TB clock RTB-x5ea Sidelying H abduction-3# 2x10 Press out fwd at cable column- 17.5lbs 2x10 Low row at cable column 27.5lbs 2x10 Sidelying fly with GTB 2x10 Sidelying ER ball catch 3#ball 2x10 Wall angel 2x10 Cable column overhead pull down- 5lbs 2x10   4/16  UBE L4 2 min retro and fwd  GHJ mobilizations inf, ant, and posterior glides grade III Supine 2# pec fly 2x10  Sleeper stretch 30s 3x Posterior capsule 30s 3x Pec stretch at doorway  30s 3x  Push up on knees 3x10 Front plank with alt shoulder taps x10ea Front plank with TB clock RTB-x5ea Sidelying H abduction-1# 2x10 Overhead press with DB- 5# x10, 7# 2x10 Press out fwd at cable column- 15lbs 2x10 Low row at cable column 25lbs 2x10   4/9  UBE L2 2 min retro and fwd  GHJ mobilizations inf, ant, and posterior glides grade III-IV Supine unweighted pec fly 2x10 3s  Posterior capsule release in s/L Sleeper stretch 30s 3x Posterior capsule 30s 3x Pec stretch at 120 30s 3x    4/5  UBE L2 2 min retro and fwd  GHJ mobilizations inf and posterior glides grade II-III PROM R shoulder Posterior capsule release in s/L Scap mobilization in sidelying Instruction in post. Capsule stretch S/L ER 5lbs x20 Standing Y with GTB 2x10 Horizontal abd with GTB 2x10 Bilateral ER GTB 2x10  Lat pull down 27.8 x30   4/2  UBE L2 2 min retro and fwd  OH band stretching series: ER, flexion, and BHB 15s 4x Landmine press 15lbs 3x8 TRX row 3x8 TRX T 3x8 Cable 7.5lb IR and ER 3x10 Counter height push ups 3x10 Landmine press 15lbs plus bar 4x6 Lat pull down 3x8 70lbs     PATIENT EDUCATION: Education details: anatomy, exercise progression, DOMS expectations, HEP, POC   Person educated: Patient Education method: Explanation, Demonstration, Tactile cues, Verbal cues, and  Handouts Education comprehension: verbalized understanding, returned demonstration, verbal cues required, and tactile cues required   HOME EXERCISE PROGRAM:   Access Code: [email or web address] (electronic) URL: [email or web address] Date: 02/28/2022 Prepared by: Zebedee Iba      ASSESSMENT:   CLINICAL IMPRESSION:  Able to progress resisted strengthening today due to no adverse response following progressions last visit. Some stiffness in R GHJ remains present so continued with PROM and joint mobilizations.  Able to complete 5 push ups from floor level without modification today. No pain with this. He fatigues in R shoulder mm, but able to complete all tasks without additional rest breaks or compensation. Will continue to progress as tolerated.     OBJECTIVE IMPAIRMENTS decreased strength, impaired UE functional use, improper body mechanics, postural dysfunction, pain, and hypermobility .    ACTIVITY LIMITATIONS community activity, occupation, dressing, bathing, self-care, and exercise/recreation .    PERSONAL FACTORS Fitness, 1-2 comorbidities, and Time since onset of injury/illness/exacerbation are also affecting patient's functional outcome.      REHAB POTENTIAL: Good   CLINICAL DECISION MAKING: Stable/uncomplicated   EVALUATION COMPLEXITY: Low   GOALS:     SHORT TERM GOALS: Target Date . 04/11/2022          Pt will become independent with HEP in order to demonstrate synthesis of PT education.     Goal status: met   2.  Pt will be able to demonstrate PROM of 90 flexion in order to demonstrate functional improvement in UE function for self-care and house hold duties.     Goal status: met   3.  Pt will be able to demonstrate ability to perform full AROM without discomfort or limitation in order to demonstrate functional improvement in UE function for self-care and house hold duties.    Goal status: ongoing     LONG TERM GOALS: Target Date 05/23/2022         Pt  will become independent with final HEP in order to demonstrate synthesis of PT education.     Goal status: ongoing   2.  Pt will be able to demonstrate ability to perform 5lb shoulder flexion, ABD, and scaption in order to demonstrate functional improvement in UE function for self-care and house hold duties.      Goal status: met   3.  Pt will be able to demonstrate 5x full push ups without pain in order to demonstrate functional improvement in UE function for progression towards return to exercise and ability to push during normal daily activity.  Goal status: ongoing   4.  Pt will be able to reach, lift, and carry OH >25lbs  in order to demonstrate functional improvement in R UE strength for transition to return to occupation.     Goal status: ongoing     PLAN: PT FREQUENCY: 1-2x/week   PT DURATION: 12 weeks   PLANNED INTERVENTIONS: Therapeutic exercises, Therapeutic activity, Neuromuscular re-education, Balance training, Gait training, Patient/Family education, Self Care, Joint mobilization, Joint manipulation, DME instructions, Aquatic Therapy, Dry Needling, Electrical stimulation, Spinal manipulation, Spinal mobilization, Cryotherapy, Moist heat, scar mobilization, Splintting, Taping, Vasopneumatic device, Traction, Ultrasound, Ionotophoresis 4mg /ml Dexamethasone, Manual therapy, and Re-evaluation   PLAN FOR NEXT SESSION: continue per [facility] repair protocol     Donnel Saxon Yentl Verge, PTA 05/25/2022, 8:22 AM

## 2022-06-15 ENCOUNTER — Encounter (HOSPITAL_BASED_OUTPATIENT_CLINIC_OR_DEPARTMENT_OTHER): Payer: Self-pay | Admitting: Physical Therapy

## 2022-06-15 ENCOUNTER — Ambulatory Visit (HOSPITAL_BASED_OUTPATIENT_CLINIC_OR_DEPARTMENT_OTHER): Payer: No Typology Code available for payment source | Attending: Orthopaedic Surgery | Admitting: Physical Therapy

## 2022-06-15 DIAGNOSIS — M6281 Muscle weakness (generalized): Secondary | ICD-10-CM | POA: Diagnosis present

## 2022-06-15 DIAGNOSIS — M25611 Stiffness of right shoulder, not elsewhere classified: Secondary | ICD-10-CM | POA: Insufficient documentation

## 2022-06-15 DIAGNOSIS — S83242A Other tear of medial meniscus, current injury, left knee, initial encounter: Secondary | ICD-10-CM | POA: Insufficient documentation

## 2022-06-15 DIAGNOSIS — M25511 Pain in right shoulder: Secondary | ICD-10-CM | POA: Diagnosis present

## 2022-06-15 NOTE — Therapy (Signed)
OUTPATIENT PHYSICAL THERAPY UPPER EXTREMITY TREATMENT   Patient Name: Brandon Lawson MRN: [id number] DOB:03-05-1975, 47 y.o., male Today's Date: 06/15/2022  END OF SESSION:  PT End of Session - 06/15/22 1309     Visit Number 18    Number of Visits 25    Date for PT Re-Evaluation 09/13/22    Authorization Type Worker's Comp    PT Start Time 1315    PT Stop Time 1355    PT Time Calculation (min) 40 min    Activity Tolerance Patient tolerated treatment well    Behavior During Therapy WFL for tasks assessed/performed                         Past Medical History:  Diagnosis Date   Asthma    seasonal only- no inhaler   HLD (hyperlipidemia)    no meds, diet controlled   Hyperlipidemia    Past Surgical History:  Procedure Laterality Date   COLONOSCOPY  2023   PECTORALIS TENDON REPAIR Right 02/24/2022   Procedure: RIGHT PECTORALIS MAJOR REPAIR;  Surgeon: Huel Cote, MD;  Location: [facility] OR;  Service: Orthopedics;  Laterality: Right;   Testicular torsion     at age 52 yrs old   Patient Active Problem List   Diagnosis Date Noted   Pectoralis muscle rupture 02/24/2022    PCP:  Georgann Housekeeper, MD       REFERRING PROVIDER:  Huel Cote, MD     REFERRING DIAG:  S29.011A (ICD-10-CM) - Rupture of pectoralis major muscle, initial encounter      THERAPY DIAG:  Acute pain of right shoulder  Muscle weakness (generalized)  Decreased right shoulder range of motion  Rationale for Evaluation and Treatment: Rehabilitation  ONSET DATE: 02/24/22 Surgery  Days since surgery: 111   PROCEDURE: 1.  Right pectoralis major repair    SUBJECTIVE:                                                                                                                                                                                      SUBJECTIVE STATEMENT:  Pt states that the shoulder is doing well. He has tried bench pressing with very light weight at this time  without pain. 95lbs of 3x10. Pt has had no pain. Has started assisted pullups and normal pull ups. Pt does not notice the shoulder with day to day but strength is not there. 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  Eval: Pt states that pain has been fairly well managed. He has only been taking the aspirin as prescribed. It will ache on  occasion but no pain. Moving around will cause it to ache. Sleeps in bed with pillows on an incline. Icing 2x a day. Keeping it on for 30-40 min at a time. Pt denies signs of infection or DVT. Requires heavy lifting, door breaching, climbing ladders, etc. At work.   PERTINENT HISTORY: N/A  PAIN:  Are you having pain? No: NPRS scale: 0/10   PRECAUTIONS: Shoulder  WEIGHT BEARING RESTRICTIONS:no  FALLS:  Has patient fallen in last 6 months? 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 Complete tear of the distal tendon of the right pectoralis major muscle with medial retraction of approximately 8 cm. 2. Evolving hematoma surrounding the torn tendon stump measuring approximately 5.7 x 4.6 x 4.6 cm.  PATIENT SURVEYS :  FOTO 34 71 @ DC 23 pts [facility]  FOTO 3/19 63  5/15 FOTO   UPPER EXTREMITY ROM:    Passive ROM Right eval R 2/5 R  3/5 R  AROM 5/15  Shoulder flexion 65 87 160 165  Shoulder extension N/A  35 40  Shoulder abduction 60 60 135 165  Shoulder adduction       Shoulder internal rotation To belly  To belly L4 p!  Shoulder external rotation N/A To neutral 35 55  (Blank rows = not tested)    AROM Right 5/15  Left 5/15  Shoulder flexion 48.2 56.6  Shoulder extension    Shoulder abduction  39.2 57.7  Shoulder adduction (pec) 38.4 46.2  Shoulder internal rotation 39.8 54.4  Shoulder external rotation 32.9 36.5  (Blank rows = not tested)              TODAY'S TREATMENT:                                                                                                                                         DATE:   5/15  Bench Technique Kneeling fall to catch (pain); counter fall to catch 4x6  Med ball chest pass (power focus) 3x8 with basketball progress to medball 15lb pec fly 3x10 incline DB bench 35lbs with IR rotation 3x8 TRX push up/bench 3x6     4/23  UBE L4 2 min retro and fwd  GHJ mobilizations inf, posterior II-III Supine 5# pec fly 3x10  Sleeper stretch 30s 3x Posterior capsule 30s 3x Pec stretch at doorway  30s 3x  Push up on knees 2x10 Push up at table x10 Full push up x5 Front plank with alt shoulder taps x10ea Plank with trunk rotation x5ea Front plank with TB clock RTB-x5ea Sidelying H abduction-3# 2x10 Press out fwd at cable  column- 17.5lbs 2x10 Low row at cable column 27.5lbs 2x10 Sidelying fly with GTB 2x10 Sidelying ER ball catch 3#ball 2x10 Wall angel 2x10 Cable column overhead pull down- 5lbs 2x10   4/16  UBE L4 2 min retro and fwd  GHJ mobilizations inf, ant, and posterior glides grade III Supine 2# pec fly 2x10  Sleeper stretch 30s 3x Posterior capsule 30s 3x Pec stretch at doorway  30s 3x  Push up on knees 3x10 Front plank with alt shoulder taps x10ea Front plank with TB clock RTB-x5ea Sidelying H abduction-1# 2x10 Overhead press with DB- 5# x10, 7# 2x10 Press out fwd at cable column- 15lbs 2x10 Low row at cable column 25lbs 2x10   4/9  UBE L2 2 min retro and fwd  GHJ mobilizations inf, ant, and posterior glides grade III-IV Supine unweighted pec fly 2x10 3s Posterior capsule release in s/L Sleeper stretch 30s 3x Posterior capsule 30s 3x Pec stretch at 120 30s 3x    4/5  UBE L2 2 min retro and fwd  GHJ  mobilizations inf and posterior glides grade II-III PROM R shoulder Posterior capsule release in s/L Scap mobilization in sidelying Instruction in post. Capsule stretch S/L ER 5lbs x20 Standing Y with GTB 2x10 Horizontal abd with GTB 2x10 Bilateral ER GTB 2x10  Lat pull down 27.8 x30   4/2  UBE L2 2 min retro and fwd  OH band stretching series: ER, flexion, and BHB 15s 4x Landmine press 15lbs 3x8 TRX row 3x8 TRX T 3x8 Cable 7.5lb IR and ER 3x10 Counter height push ups 3x10 Landmine press 15lbs plus bar 4x6 Lat pull down 3x8 70lbs     PATIENT EDUCATION: Education details: anatomy, exercise progression, DOMS expectations, HEP, POC   Person educated: Patient Education method: Explanation, Demonstration, Tactile cues, Verbal cues, and Handouts Education comprehension: verbalized understanding, returned demonstration, verbal cues required, and tactile cues required   HOME EXERCISE PROGRAM:   Access Code: [email or web address] (electronic) URL: [email or web address] Date: 02/28/2022 Prepared by: Zebedee Iba      ASSESSMENT:   CLINICAL IMPRESSION:  Pt now at 80% pec strength on the R side. Session used to progress plyometrics, force development, and strength of the pec through the entire arc of motion. Only pain noted during session with full kneeling fall to catch so modified to counter height. Pt advised of exercise progression with exercise and precautions with throwing. Pt nearly 16 wks at this time. [facility] return to throwing program provided today and explained. Pt to drop frequency to 1x/every 3 wks to faciliate independent exercise. Likely to D/C in 2-3 more sessions. Current strength deficits and lack of power development likely incompatible with duty/occupational needs as a IT sales professional. Pt would benefit from continued skilled therapy in order to reach goals and maximize functional R UE strength and ROM for full return to PLOF and duty.     OBJECTIVE IMPAIRMENTS  decreased strength, impaired UE functional use, improper body mechanics, postural dysfunction, pain, and hypermobility .    ACTIVITY LIMITATIONS community activity, occupation, dressing, bathing, self-care, and exercise/recreation .    PERSONAL FACTORS Fitness, 1-2 comorbidities, and Time since onset of injury/illness/exacerbation are also affecting patient's functional outcome.      REHAB POTENTIAL: Good   CLINICAL DECISION MAKING: Stable/uncomplicated   EVALUATION COMPLEXITY: Low   GOALS:     SHORT TERM GOALS: Target Date . 04/11/2022     Pt will become independent with HEP in order to demonstrate synthesis of PT education.     Goal  status: met   2.  Pt will be able to demonstrate PROM of 90 flexion in order to demonstrate functional improvement in UE function for self-care and house hold duties.     Goal status: met   3.  Pt will be able to demonstrate ability to perform full AROM without discomfort or limitation in order to demonstrate functional improvement in UE function for self-care and house hold duties.    Goal status: ongoing     LONG TERM GOALS: Target Date 09/07/2022         Pt  will become independent with final HEP in order to demonstrate synthesis of PT education.     Goal status: ongoing   2.  Pt will be able to demonstrate ability to perform 5lb shoulder flexion, ABD, and scaption in order to demonstrate functional improvement in UE function for self-care and house hold duties.      Goal status: met   3.  Pt will be able to demonstrate 5x full push ups without pain in order to demonstrate functional improvement in UE function for progression towards return to exercise and ability to push during normal daily activity.      Goal status: met   4.  Pt will be able to reach, lift, and carry OH >25lbs  in order to demonstrate functional improvement in R UE strength for transition to return to occupation.     Goal status: ongoing  5.  Pt will be  able to demonstrate ability to throw at at least 80% effort without pain  in order to demonstrate functional improvement in UE for return to exercise, duty, and play with children.      Goal status: NEW  6.  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      Goal status: NEW     PLAN: PT FREQUENCY: 1x/ every 3 wks   PT DURATION: 12 weeks   PLANNED INTERVENTIONS: Therapeutic exercises, Therapeutic activity, Neuromuscular re-education, Balance training, Gait training, Patient/Family education, Self Care, Joint mobilization, Joint manipulation, DME instructions, Aquatic Therapy, Dry Needling, Electrical stimulation, Spinal manipulation, Spinal mobilization, Cryotherapy, Moist heat, scar mobilization, Splintting, Taping, Vasopneumatic device, Traction, Ultrasound, Ionotophoresis 4mg /ml Dexamethasone, Manual therapy, and Re-evaluation   PLAN FOR NEXT SESSION: continue per [facility] repair protocol     Zebedee Iba, PT 06/15/2022, 2:03 PM

## 2022-07-05 ENCOUNTER — Ambulatory Visit (HOSPITAL_BASED_OUTPATIENT_CLINIC_OR_DEPARTMENT_OTHER): Payer: No Typology Code available for payment source | Attending: Orthopaedic Surgery | Admitting: Physical Therapy

## 2022-07-05 ENCOUNTER — Encounter (HOSPITAL_BASED_OUTPATIENT_CLINIC_OR_DEPARTMENT_OTHER): Payer: Self-pay | Admitting: Physical Therapy

## 2022-07-05 DIAGNOSIS — M6281 Muscle weakness (generalized): Secondary | ICD-10-CM | POA: Diagnosis present

## 2022-07-05 DIAGNOSIS — M25511 Pain in right shoulder: Secondary | ICD-10-CM | POA: Insufficient documentation

## 2022-07-05 DIAGNOSIS — M25611 Stiffness of right shoulder, not elsewhere classified: Secondary | ICD-10-CM | POA: Diagnosis present

## 2022-07-05 NOTE — Therapy (Signed)
OUTPATIENT PHYSICAL THERAPY UPPER EXTREMITY TREATMENT   Patient Name: Brandon Lawson MRN: [id number] DOB:Apr 28, 1975, 47 y.o., male Today's Date: 07/05/2022  END OF SESSION:  PT End of Session - 07/05/22 0934     Visit Number 19    Number of Visits 25    Date for PT Re-Evaluation 09/13/22    Authorization Type Worker's Comp    PT Start Time [phone]    PT Stop Time 1001    PT Time Calculation (min) 28 min    Activity Tolerance Patient tolerated treatment well    Behavior During Therapy WFL for tasks assessed/performed                         Past Medical History:  Diagnosis Date   Asthma    seasonal only- no inhaler   HLD (hyperlipidemia)    no meds, diet controlled   Hyperlipidemia    Past Surgical History:  Procedure Laterality Date   COLONOSCOPY  2023   PECTORALIS TENDON REPAIR Right 02/24/2022   Procedure: RIGHT PECTORALIS MAJOR REPAIR;  Surgeon: Huel Cote, MD;  Location: [facility] OR;  Service: Orthopedics;  Laterality: Right;   Testicular torsion     at age 24 yrs old   Patient Active Problem List   Diagnosis Date Noted   Pectoralis muscle rupture 02/24/2022    PCP:  Georgann Housekeeper, MD       REFERRING PROVIDER:  Huel Cote, MD     REFERRING DIAG:  S29.011A (ICD-10-CM) - Rupture of pectoralis major muscle, initial encounter      THERAPY DIAG:  Acute pain of right shoulder  Muscle weakness (generalized)  Decreased right shoulder range of motion  Rationale for Evaluation and Treatment: Rehabilitation  ONSET DATE: 02/24/22 Surgery  Days since surgery: 131   PROCEDURE: 1.  Right pectoralis major repair    SUBJECTIVE:                                                                                                                                                                                      SUBJECTIVE STATEMENT:  Pt states the shoulder feels stiff with movement initially but goes away with warm up. Pt is able to lift,  hammer, and climb ladders during a work exercise but has not done it repetitively yet. Pt has not gotten into throwing program fully.   Eval: Pt states that pain has been fairly well managed. He has only been taking the aspirin as prescribed. It will ache on occasion but no pain. Moving around will cause it to ache. Sleeps in bed with pillows on an incline. Icing 2x a day.  Keeping it on for 30-40 min at a time. Pt denies signs of infection or DVT. Requires heavy lifting, door breaching, climbing ladders, etc. At work.   PERTINENT HISTORY: N/A  PAIN:  Are you having pain? No: NPRS scale: 0/10   PRECAUTIONS: Shoulder  WEIGHT BEARING RESTRICTIONS:no  FALLS:  Has patient fallen in last 6 months? 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 Complete tear of the distal tendon of the right pectoralis major muscle with medial retraction of approximately 8 cm. 2. Evolving hematoma surrounding the torn tendon stump measuring approximately 5.7 x 4.6 x 4.6 cm.  PATIENT SURVEYS :  FOTO 34 71 @ DC 23 pts [facility]  FOTO 3/19 63  5/15 FOTO   UPPER EXTREMITY ROM:    Passive ROM Right eval R 2/5 R  3/5 R  AROM 5/15  Shoulder flexion 65 87 160 165  Shoulder extension N/A  35 40  Shoulder abduction 60 60 135 165  Shoulder adduction       Shoulder internal rotation To belly  To belly L4 p!  Shoulder external rotation N/A To neutral 35 55  (Blank rows = not tested)    AROM Right 5/15  Left 5/15  Shoulder flexion 48.2 56.6  Shoulder extension    Shoulder abduction 39.2 57.7  Shoulder adduction (pec) 38.4 46.2  Shoulder internal rotation 39.8 54.4  Shoulder external  rotation 32.9 36.5  (Blank rows = not tested)              TODAY'S TREATMENT:                                                                                                                                         DATE:   6/4  UBE L4 2 min retro and fwd  Kneeling fall to catch 3x5 44lb KB push press 3x5 4lb med ball seated shotput 3x5 8lb med ball explosive chest pass to catch and land 3x5    5/15  Bench Technique Kneeling fall to catch (pain); counter fall to catch 4x6  Med ball chest pass (power focus) 3x8 with basketball progress to medball 15lb pec fly 3x10 incline DB bench 35lbs with IR rotation 3x8 TRX push up/bench 3x6     4/23  UBE L4 2 min retro and fwd  GHJ mobilizations inf, posterior II-III Supine 5# pec fly 3x10  Sleeper stretch 30s 3x Posterior capsule 30s 3x Pec stretch at doorway  30s 3x  Push up on knees 2x10 Push up at table x10 Full push up x5 Front plank with alt shoulder taps x10ea Plank with trunk  rotation x5ea Front plank with TB clock RTB-x5ea Sidelying H abduction-3# 2x10 Press out fwd at cable column- 17.5lbs 2x10 Low row at cable column 27.5lbs 2x10 Sidelying fly with GTB 2x10 Sidelying ER ball catch 3#ball 2x10 Wall angel 2x10 Cable column overhead pull down- 5lbs 2x10   4/16  UBE L4 2 min retro and fwd  GHJ mobilizations inf, ant, and posterior glides grade III Supine 2# pec fly 2x10  Sleeper stretch 30s 3x Posterior capsule 30s 3x Pec stretch at doorway  30s 3x  Push up on knees 3x10 Front plank with alt shoulder taps x10ea Front plank with TB clock RTB-x5ea Sidelying H abduction-1# 2x10 Overhead press with DB- 5# x10, 7# 2x10 Press out fwd at cable column- 15lbs 2x10 Low row at cable column 25lbs 2x10   4/9  UBE L2 2 min retro and fwd  GHJ mobilizations inf, ant, and posterior glides grade III-IV Supine unweighted pec fly 2x10 3s Posterior capsule release in s/L Sleeper stretch 30s 3x Posterior capsule 30s  3x Pec stretch at 120 30s 3x    4/5  UBE L2 2 min retro and fwd  GHJ mobilizations inf and posterior glides grade II-III PROM R shoulder Posterior capsule release in s/L Scap mobilization in sidelying Instruction in post. Capsule stretch S/L ER 5lbs x20 Standing Y with GTB 2x10 Horizontal abd with GTB 2x10 Bilateral ER GTB 2x10  Lat pull down 27.8 x30   4/2  UBE L2 2 min retro and fwd  OH band stretching series: ER, flexion, and BHB 15s 4x Landmine press 15lbs 3x8 TRX row 3x8 TRX T 3x8 Cable 7.5lb IR and ER 3x10 Counter height push ups 3x10 Landmine press 15lbs plus bar 4x6 Lat pull down 3x8 70lbs     PATIENT EDUCATION: Education details: anatomy, exercise progression, DOMS expectations, HEP, POC   Person educated: Patient Education method: Explanation, Demonstration, Tactile cues, Verbal cues, and Handouts Education comprehension: verbalized understanding, returned demonstration, verbal cues required, and tactile cues required   HOME EXERCISE PROGRAM:   Access Code: [email or web address] (electronic) URL: [email or web address] Date: 02/28/2022 Prepared by: Zebedee Iba      ASSESSMENT:   CLINICAL IMPRESSION:  Pt progress RFD and plyometrics at today's session with minor ant shoulder discomfort. Pt able to perform explosive reps of a vertical and horizontal press/push motion without pain and demo'd good stability. Pt demonstrates strong force development and power with both movements. Plan for re-assess of strength at next session with likely D/C in 2 more visits. 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 Pt advised to perform his normal gym routine at this point and to being working through throwing program. Pt would benefit from continued skilled therapy in order to reach goals and maximize functional R UE strength and ROM for full return to PLOF and duty.      OBJECTIVE IMPAIRMENTS decreased strength, impaired UE functional use, improper body mechanics, postural dysfunction, pain, and hypermobility .    ACTIVITY LIMITATIONS community activity, occupation, dressing, bathing, self-care, and exercise/recreation .    PERSONAL FACTORS Fitness, 1-2 comorbidities, and Time since onset of injury/illness/exacerbation are also affecting patient's functional outcome.      REHAB POTENTIAL: Good   CLINICAL DECISION MAKING: Stable/uncomplicated   EVALUATION COMPLEXITY: Low   GOALS:     SHORT TERM GOALS: Target Date . 04/11/2022     Pt will become independent with HEP in order to demonstrate synthesis  of PT education.     Goal status: met   2.  Pt will be able to demonstrate PROM of 90 flexion in order to demonstrate functional improvement in UE function for self-care and house hold duties.     Goal status: met   3.  Pt will be able to demonstrate ability to perform full AROM without discomfort or limitation in order to demonstrate functional improvement in UE function for self-care and house hold duties.    Goal status: met     LONG TERM GOALS: Target Date 09/07/2022         Pt  will become independent with final HEP in order to demonstrate synthesis of PT education.     Goal status: ongoing   2.  Pt will be able to demonstrate ability to perform 5lb shoulder flexion, ABD, and scaption in order to demonstrate functional improvement in UE function for self-care and house hold duties.      Goal status: met   3.  Pt will be able to demonstrate 5x full push ups without pain in order to demonstrate functional improvement in UE function for progression towards return to exercise and ability to push during normal daily activity.      Goal status: met   4.  Pt will be able to reach, lift, and carry OH >25lbs  in order to demonstrate functional improvement in R UE strength for transition to return to occupation.     Goal status: met  5.   Pt will be able to demonstrate ability to throw at at least 80% effort without pain  in order to demonstrate functional improvement in UE for return to exercise, duty, and play with children.      Goal status: NEW  6.  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      Goal status: NEW     PLAN: PT FREQUENCY: 1x/ every 3 wks   PT DURATION: 12 weeks   PLANNED INTERVENTIONS: Therapeutic exercises, Therapeutic activity, Neuromuscular re-education, Balance training, Gait training, Patient/Family education, Self Care, Joint mobilization, Joint manipulation, DME instructions, Aquatic Therapy, Dry Needling, Electrical stimulation, Spinal manipulation, Spinal mobilization, Cryotherapy, Moist heat, scar mobilization, Splintting, Taping, Vasopneumatic device, Traction, Ultrasound, Ionotophoresis 4mg /ml Dexamethasone, Manual therapy, and Re-evaluation   PLAN FOR NEXT SESSION: continue per [facility] repair protocol     Zebedee Iba, PT 07/05/2022, 10:23 AM

## 2022-07-26 ENCOUNTER — Ambulatory Visit (HOSPITAL_BASED_OUTPATIENT_CLINIC_OR_DEPARTMENT_OTHER): Payer: No Typology Code available for payment source | Admitting: Physical Therapy

## 2022-08-17 ENCOUNTER — Ambulatory Visit (HOSPITAL_BASED_OUTPATIENT_CLINIC_OR_DEPARTMENT_OTHER): Payer: No Typology Code available for payment source | Attending: Orthopaedic Surgery | Admitting: Physical Therapy

## 2022-08-17 ENCOUNTER — Ambulatory Visit (HOSPITAL_BASED_OUTPATIENT_CLINIC_OR_DEPARTMENT_OTHER): Payer: No Typology Code available for payment source | Admitting: Orthopaedic Surgery

## 2022-08-17 DIAGNOSIS — S29011A Strain of muscle and tendon of front wall of thorax, initial encounter: Secondary | ICD-10-CM | POA: Diagnosis not present

## 2022-08-17 NOTE — Progress Notes (Signed)
Post Operative Evaluation    Procedure/Date of Surgery: Right pectoralis major repair 02/24/22  Interval History:    Presents today for follow-up of his right pectoralis major repair.  He is overall doing well.  His strength is dramatically improved.  He is back to essentially full activity.     PMH/PSH/Family History/Social History/Meds/Allergies:    Past Medical History:  Diagnosis Date   Asthma    seasonal only- no inhaler   HLD (hyperlipidemia)    no meds, diet controlled   Hyperlipidemia    Past Surgical History:  Procedure Laterality Date   COLONOSCOPY  2023   PECTORALIS TENDON REPAIR Right 02/24/2022   Procedure: RIGHT PECTORALIS MAJOR REPAIR;  Surgeon: Huel Cote, MD;  Location: [facility] OR;  Service: Orthopedics;  Laterality: Right;   Testicular torsion     at age 47 yrs old   Social History   Socioeconomic History   Marital status: Married    Spouse name: Not on file   Number of children: 3   Years of education: Not on file   Highest education level: Bachelor's degree (e.g., BA, AB, BS)  Occupational History   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    Employer: [employer]  Tobacco Use   Smoking status: Former   Smokeless tobacco: Current    Types: Chew   Tobacco comments:    every now and then  while in college only  Vaping Use   Vaping status: Never Used  Substance and Sexual Activity   Alcohol use: Yes    Alcohol/week: 3.0 - 5.0 standard drinks of alcohol    Types: 3 - 5 Glasses of wine per week    Comment: Socially    Drug use: No   Sexual activity: Yes  Other Topics Concern   Not on file  Social History Narrative   Not on file   Social Determinants of Health   Financial Resource Strain: Not on file  Food Insecurity: Not on file  Transportation Needs: Not on file  Physical Activity: Sufficiently Active (04/14/2017)   Exercise Vital Sign    Days of Exercise per Week: 5 days    Minutes of Exercise per Session: 30  min  Stress: Not on file  Social Connections: Not on file   Family History  Problem Relation Age of Onset   Hypertension Mother    Hyperlipidemia Mother    Heart attack Father    Heart disease Father    Coronary artery disease Father    Heart attack Maternal Grandmother    No Known Allergies Current Outpatient Medications  Medication Sig Dispense Refill   aspirin EC 325 MG tablet Take 1 tablet (325 mg total) by mouth daily. (Patient taking differently: Take 325 mg by mouth daily. For after surgery) 30 tablet 0   fluticasone (FLONASE) 50 MCG/ACT nasal spray Place 1 spray into both nostrils daily.     naphazoline-glycerin (CLEAR EYES REDNESS) 0.012-0.2 % SOLN Place 1-2 drops into both eyes 4 (four) times daily as needed for eye irritation.     naproxen (NAPROSYN) 500 MG tablet Take 1 tablet (500 mg total) by mouth 2 (two) times daily. (Patient not taking: Reported on 02/21/2022) 30 tablet 0   Omega-3 1000 MG CAPS Take 1,000 mg by mouth daily.     oxyCODONE (OXY IR/ROXICODONE) 5 MG  immediate release tablet Take 1 tablet (5 mg total) by mouth every 4 (four) hours as needed (severe pain). 20 tablet 0   No current facility-administered medications for this visit.   No results found.  Review of Systems:   A ROS was performed including pertinent positives and negatives as documented in the HPI.   Musculoskeletal Exam:    There were no vitals taken for this visit.  Right shoulder incision is healed.  No palpable defect in the axillary fold.  Improved pectoralis tone.  Forward active elevation is to 170 degrees equal to contralateral side.  External rotation is to 50 degrees bilaterally.  Internal rotation is to L1 bilaterally.  Is able to flex and extend at the right elbow and right wrist.  Fires EPL.  2+ radial pulse  Imaging:      I personally reviewed and interpreted the radiographs.   Assessment:   86-month status post right pectoralis tendon injury repair doing very well.  At  this time he will be full return to work without any restrictions.  I will plan to see him back as needed  Plan :    -Return to clinic as needed      I personally saw and evaluated the patient, and participated in the management and treatment plan.  Huel Cote, MD Attending Physician, Orthopedic Surgery  This document was dictated using Dragon voice recognition software. A reasonable attempt at proof reading has been made to minimize errors.

## 2022-10-25 ENCOUNTER — Other Ambulatory Visit (HOSPITAL_BASED_OUTPATIENT_CLINIC_OR_DEPARTMENT_OTHER): Payer: Self-pay | Admitting: Internal Medicine

## 2022-10-25 DIAGNOSIS — Z8249 Family history of ischemic heart disease and other diseases of the circulatory system: Secondary | ICD-10-CM

## 2022-11-25 ENCOUNTER — Ambulatory Visit (HOSPITAL_BASED_OUTPATIENT_CLINIC_OR_DEPARTMENT_OTHER)
Admission: RE | Admit: 2022-11-25 | Discharge: 2022-11-25 | Disposition: A | Discharge status: Home / Self Care | Payer: 59 | Source: Ambulatory Visit | Attending: Internal Medicine | Admitting: Internal Medicine

## 2022-11-25 DIAGNOSIS — Z8249 Family history of ischemic heart disease and other diseases of the circulatory system: Secondary | ICD-10-CM | POA: Insufficient documentation

## 2024-03-07 ENCOUNTER — Other Ambulatory Visit (HOSPITAL_BASED_OUTPATIENT_CLINIC_OR_DEPARTMENT_OTHER): Payer: Self-pay | Admitting: Family Medicine

## 2024-03-07 DIAGNOSIS — Z8249 Family history of ischemic heart disease and other diseases of the circulatory system: Secondary | ICD-10-CM

## 2024-04-15 ENCOUNTER — Other Ambulatory Visit (HOSPITAL_BASED_OUTPATIENT_CLINIC_OR_DEPARTMENT_OTHER)
